# Patient Record
Sex: Female | Born: 1970
Health system: Southern US, Community
[De-identification: ages and names within clinical notes are randomized; demographics above are authoritative.]

## PROBLEM LIST (undated history)

## (undated) DIAGNOSIS — M1990 Unspecified osteoarthritis, unspecified site: Secondary | ICD-10-CM

## (undated) DIAGNOSIS — M419 Scoliosis, unspecified: Secondary | ICD-10-CM

## (undated) HISTORY — DX: Unspecified osteoarthritis, unspecified site: M19.90

## (undated) HISTORY — DX: Scoliosis, unspecified: M41.9

## (undated) HISTORY — PX: HAND SURGERY: SHX662

---

## 1995-08-14 HISTORY — PX: COCCYX REMOVAL: SHX600

## 1998-07-05 ENCOUNTER — Ambulatory Visit (HOSPITAL_COMMUNITY): Admission: RE | Admit: 1998-07-05 | Discharge: 1998-07-05 | Payer: Self-pay | Admitting: Obstetrics & Gynecology

## 1998-07-05 ENCOUNTER — Encounter: Payer: Self-pay | Admitting: Obstetrics & Gynecology

## 1999-07-04 ENCOUNTER — Encounter: Payer: Self-pay | Admitting: Obstetrics and Gynecology

## 1999-07-04 ENCOUNTER — Ambulatory Visit (HOSPITAL_COMMUNITY): Admission: RE | Admit: 1999-07-04 | Discharge: 1999-07-04 | Payer: Self-pay | Admitting: Obstetrics and Gynecology

## 1999-11-13 ENCOUNTER — Inpatient Hospital Stay (HOSPITAL_COMMUNITY): Admission: AD | Admit: 1999-11-13 | Discharge: 1999-11-13 | Payer: Self-pay | Admitting: Obstetrics and Gynecology

## 1999-11-30 ENCOUNTER — Inpatient Hospital Stay (HOSPITAL_COMMUNITY): Admission: AD | Admit: 1999-11-30 | Discharge: 1999-12-02 | Payer: Self-pay | Admitting: Obstetrics and Gynecology

## 2000-04-03 ENCOUNTER — Other Ambulatory Visit: Admission: RE | Admit: 2000-04-03 | Discharge: 2000-04-03 | Payer: Self-pay | Admitting: Obstetrics and Gynecology

## 2000-08-13 HISTORY — PX: CHOLECYSTECTOMY: SHX55

## 2000-11-27 ENCOUNTER — Emergency Department (HOSPITAL_COMMUNITY): Admission: EM | Admit: 2000-11-27 | Discharge: 2000-11-27 | Payer: Self-pay | Admitting: Emergency Medicine

## 2000-12-11 ENCOUNTER — Encounter: Admission: RE | Admit: 2000-12-11 | Discharge: 2000-12-11 | Payer: Self-pay

## 2001-05-02 ENCOUNTER — Other Ambulatory Visit: Admission: RE | Admit: 2001-05-02 | Discharge: 2001-05-02 | Payer: Self-pay | Admitting: Obstetrics and Gynecology

## 2001-05-22 ENCOUNTER — Encounter: Admission: RE | Admit: 2001-05-22 | Discharge: 2001-06-30 | Payer: Self-pay | Admitting: Orthopaedic Surgery

## 2001-08-12 ENCOUNTER — Encounter: Payer: Self-pay | Admitting: Orthopaedic Surgery

## 2001-08-12 ENCOUNTER — Encounter: Admission: RE | Admit: 2001-08-12 | Discharge: 2001-08-12 | Payer: Self-pay | Admitting: Orthopaedic Surgery

## 2001-09-26 ENCOUNTER — Encounter: Payer: Self-pay | Admitting: Family Medicine

## 2001-09-26 ENCOUNTER — Encounter: Admission: RE | Admit: 2001-09-26 | Discharge: 2001-09-26 | Payer: Self-pay | Admitting: Family Medicine

## 2002-05-08 ENCOUNTER — Other Ambulatory Visit: Admission: RE | Admit: 2002-05-08 | Discharge: 2002-05-08 | Payer: Self-pay | Admitting: Obstetrics and Gynecology

## 2003-09-02 ENCOUNTER — Other Ambulatory Visit: Admission: RE | Admit: 2003-09-02 | Discharge: 2003-09-02 | Payer: Self-pay | Admitting: Obstetrics and Gynecology

## 2004-03-09 ENCOUNTER — Inpatient Hospital Stay (HOSPITAL_COMMUNITY): Admission: AD | Admit: 2004-03-09 | Discharge: 2004-03-11 | Payer: Self-pay | Admitting: Obstetrics and Gynecology

## 2004-03-26 ENCOUNTER — Inpatient Hospital Stay (HOSPITAL_COMMUNITY): Admission: AD | Admit: 2004-03-26 | Discharge: 2004-03-29 | Payer: Self-pay | Admitting: Obstetrics and Gynecology

## 2005-09-11 ENCOUNTER — Encounter: Admission: RE | Admit: 2005-09-11 | Discharge: 2005-09-11 | Payer: Self-pay | Admitting: Family Medicine

## 2005-09-17 ENCOUNTER — Encounter: Admission: RE | Admit: 2005-09-17 | Discharge: 2005-09-17 | Payer: Self-pay | Admitting: Family Medicine

## 2005-09-25 ENCOUNTER — Other Ambulatory Visit: Admission: RE | Admit: 2005-09-25 | Discharge: 2005-09-25 | Payer: Self-pay | Admitting: Obstetrics and Gynecology

## 2006-11-07 ENCOUNTER — Ambulatory Visit: Payer: Self-pay | Admitting: Family Medicine

## 2006-11-07 LAB — CONVERTED CEMR LAB
Basophils Absolute: 0 10*3/uL (ref 0.0–0.1)
Basophils Relative: 0.3 % (ref 0.0–1.0)
Cholesterol: 170 mg/dL (ref 0–200)
Eosinophils Absolute: 0.1 10*3/uL (ref 0.0–0.6)
Eosinophils Relative: 1.3 % (ref 0.0–5.0)
Glucose, Bld: 80 mg/dL (ref 70–99)
HCT: 39.5 % (ref 36.0–46.0)
HDL: 36.1 mg/dL — ABNORMAL LOW (ref >39.0)
Hemoglobin: 13.7 g/dL (ref 12.0–15.0)
LDL Cholesterol: 115 mg/dL — ABNORMAL HIGH (ref 0–99)
Lymphocytes Relative: 33 % (ref 12.0–46.0)
MCHC: 34.8 g/dL (ref 30.0–36.0)
MCV: 83.4 fL (ref 78.0–100.0)
Monocytes Absolute: 0.5 10*3/uL (ref 0.2–0.7)
Monocytes Relative: 5.8 % (ref 3.0–11.0)
Neutro Abs: 5 10*3/uL (ref 1.4–7.7)
Neutrophils Relative %: 59.6 % (ref 43.0–77.0)
Platelets: 296 10*3/uL (ref 150–400)
RBC: 4.74 M/uL (ref 3.87–5.11)
RDW: 12.9 % (ref 11.5–14.6)
TSH: 2.83 microintl units/mL (ref 0.35–5.50)
Total CHOL/HDL Ratio: 4.7
Triglycerides: 95 mg/dL (ref 0–149)
VLDL: 19 mg/dL (ref 0–40)
WBC: 8.3 10*3/uL (ref 4.5–10.5)

## 2007-04-30 ENCOUNTER — Ambulatory Visit: Payer: Self-pay | Admitting: Family Medicine

## 2007-04-30 DIAGNOSIS — M129 Arthropathy, unspecified: Secondary | ICD-10-CM | POA: Insufficient documentation

## 2007-04-30 LAB — CONVERTED CEMR LAB: Anti Nuclear Antibody(ANA): NEGATIVE

## 2007-05-04 LAB — CONVERTED CEMR LAB
Basophils Absolute: 0.1 10*3/uL (ref 0.0–0.1)
MCHC: 33.7 g/dL (ref 30.0–36.0)
Monocytes Absolute: 0.6 10*3/uL (ref 0.2–0.7)
Neutrophils Relative %: 52.7 % (ref 43.0–77.0)
Platelets: 245 10*3/uL (ref 150–400)
RBC: 4.46 M/uL (ref 3.87–5.11)
RDW: 13.4 % (ref 11.5–14.6)
Rhuematoid fact SerPl-aCnc: <20 intl units/mL — ABNORMAL LOW (ref 0.0–20.0)
Sed Rate: 40 mm/hr — ABNORMAL HIGH (ref 0–25)
WBC: 6.8 10*3/uL (ref 4.5–10.5)

## 2007-06-20 ENCOUNTER — Encounter (INDEPENDENT_AMBULATORY_CARE_PROVIDER_SITE_OTHER): Payer: Self-pay | Admitting: Family Medicine

## 2007-09-18 ENCOUNTER — Telehealth (INDEPENDENT_AMBULATORY_CARE_PROVIDER_SITE_OTHER): Payer: Self-pay | Admitting: *Deleted

## 2007-09-19 ENCOUNTER — Ambulatory Visit: Payer: Self-pay | Admitting: Family Medicine

## 2007-09-19 DIAGNOSIS — K209 Esophagitis, unspecified without bleeding: Secondary | ICD-10-CM | POA: Insufficient documentation

## 2007-09-19 DIAGNOSIS — R079 Chest pain, unspecified: Secondary | ICD-10-CM | POA: Insufficient documentation

## 2007-09-22 ENCOUNTER — Encounter (INDEPENDENT_AMBULATORY_CARE_PROVIDER_SITE_OTHER): Payer: Self-pay | Admitting: *Deleted

## 2007-09-24 ENCOUNTER — Ambulatory Visit: Payer: Self-pay | Admitting: Family Medicine

## 2007-09-26 ENCOUNTER — Telehealth (INDEPENDENT_AMBULATORY_CARE_PROVIDER_SITE_OTHER): Payer: Self-pay | Admitting: *Deleted

## 2007-10-24 ENCOUNTER — Ambulatory Visit: Payer: Self-pay | Admitting: Family Medicine

## 2007-10-24 DIAGNOSIS — IMO0001 Reserved for inherently not codable concepts without codable children: Secondary | ICD-10-CM | POA: Insufficient documentation

## 2007-10-27 ENCOUNTER — Encounter (INDEPENDENT_AMBULATORY_CARE_PROVIDER_SITE_OTHER): Payer: Self-pay | Admitting: *Deleted

## 2007-11-05 ENCOUNTER — Ambulatory Visit: Payer: Self-pay | Admitting: Cardiology

## 2007-11-20 ENCOUNTER — Ambulatory Visit: Payer: Self-pay | Admitting: Gastroenterology

## 2007-11-20 LAB — CONVERTED CEMR LAB: Tissue Transglutaminase Ab, IgA: 0.5 units (ref <7)

## 2007-11-24 ENCOUNTER — Ambulatory Visit: Payer: Self-pay | Admitting: Gastroenterology

## 2007-11-24 ENCOUNTER — Encounter: Payer: Self-pay | Admitting: Internal Medicine

## 2007-11-24 ENCOUNTER — Encounter: Payer: Self-pay | Admitting: Gastroenterology

## 2007-12-10 ENCOUNTER — Ambulatory Visit: Payer: Self-pay | Admitting: Internal Medicine

## 2008-02-06 ENCOUNTER — Ambulatory Visit (HOSPITAL_COMMUNITY): Admission: RE | Admit: 2008-02-06 | Discharge: 2008-02-06 | Payer: Self-pay | Admitting: Obstetrics and Gynecology

## 2008-02-25 ENCOUNTER — Encounter: Admission: RE | Admit: 2008-02-25 | Discharge: 2008-02-25 | Payer: Self-pay | Admitting: Obstetrics and Gynecology

## 2008-07-14 ENCOUNTER — Emergency Department (HOSPITAL_COMMUNITY): Admission: EM | Admit: 2008-07-14 | Discharge: 2008-07-14 | Payer: Self-pay | Admitting: Family Medicine

## 2008-08-30 ENCOUNTER — Encounter: Admission: RE | Admit: 2008-08-30 | Discharge: 2008-08-30 | Payer: Self-pay | Admitting: Obstetrics and Gynecology

## 2009-04-21 ENCOUNTER — Ambulatory Visit: Payer: Self-pay | Admitting: Family Medicine

## 2009-05-12 ENCOUNTER — Ambulatory Visit: Payer: Self-pay | Admitting: Family Medicine

## 2009-05-18 ENCOUNTER — Telehealth (INDEPENDENT_AMBULATORY_CARE_PROVIDER_SITE_OTHER): Payer: Self-pay | Admitting: *Deleted

## 2009-05-18 ENCOUNTER — Telehealth: Payer: Self-pay | Admitting: Family Medicine

## 2009-05-18 LAB — CONVERTED CEMR LAB
ALT: 56 units/L — ABNORMAL HIGH (ref 0–35)
Albumin: 3.8 g/dL (ref 3.5–5.2)
Basophils Relative: 0.3 % (ref 0.0–3.0)
Chloride: 104 meq/L (ref 96–112)
Creatinine, Ser: 0.6 mg/dL (ref 0.4–1.2)
Eosinophils Relative: 1.6 % (ref 0.0–5.0)
GFR calc non Af Amer: 118.83 mL/min (ref >60)
Hemoglobin: 13.1 g/dL (ref 12.0–15.0)
LDL Cholesterol: 107 mg/dL — ABNORMAL HIGH (ref 0–99)
MCHC: 34.5 g/dL (ref 30.0–36.0)
Monocytes Absolute: 0.6 10*3/uL (ref 0.1–1.0)
Monocytes Relative: 7.4 % (ref 3.0–12.0)
Neutro Abs: 4.3 10*3/uL (ref 1.4–7.7)
Potassium: 4.3 meq/L (ref 3.5–5.1)
RBC: 4.43 M/uL (ref 3.87–5.11)
TSH: 2.03 microintl units/mL (ref 0.35–5.50)
Triglycerides: 109 mg/dL (ref 0.0–149.0)
VLDL: 21.8 mg/dL (ref 0.0–40.0)
WBC: 7.6 10*3/uL (ref 4.5–10.5)

## 2009-05-23 ENCOUNTER — Encounter: Payer: Self-pay | Admitting: Family Medicine

## 2009-10-20 ENCOUNTER — Telehealth (INDEPENDENT_AMBULATORY_CARE_PROVIDER_SITE_OTHER): Payer: Self-pay | Admitting: *Deleted

## 2010-09-12 NOTE — Progress Notes (Signed)
   Phone Note From Other Clinic   Caller: [facility] Details for Reason: Pt.Information Initial call taken by: KM    Faxed 12 lead over to [facility] to fax [phone] [facility]  October 20, 2009 10:44 AM

## 2010-12-26 NOTE — Assessment & Plan Note (Signed)
[facility]                         GASTROENTEROLOGY OFFICE NOTE   KAREEMAH, GROUNDS                      MRN:          [id number]  DATE:11/20/2007                            DOB:          [birth year]    Mrs. Bufano is a 40 year old [place] female referred through the  courtesy of Dr. Blossom Hoops for evaluation of acid reflux symptoms.   Mrs. Castrillo had cholecystectomy in April 2002 because of abdominal and  chest pain with gas and bloating, but her symptoms did not abate.  She  has continued with rather typical burping, belching, regurgitation and  burning substernal chest pain despite taking a variety of medications.  She most recently had been on Prevacid with some improvement, but ran  out about a week ago and has worsening of her symptoms.  She gives a  clear history of [name] which contributes to her reflux.  She denies a globus sensation, cough, hoarseness, asthma, or dysphagia.  She denies any known hepatobiliary complaints.  She has regular bowel  movements without melena or hematochezia.  She does have a lot of  nocturnal acid reflux symptoms additionally.   PAST MEDICAL HISTORY:  Remarkable for:  1. Fibromyalgia, she has multiple joint aches and pains.  2. She also may well have myasthenia gravis, since she is having some      problems with left eye drooping and is being evaluated      neurologically.  She has been seen by Dr. Antoine Poche, has had a      negative cardiac evaluation but has not had stress testing.  She      otherwise has been in good health and denies chronic medical      problems except for some low back disk problems.  Her gallbladder      was removed in April of 2002.   MEDICATIONS:  1. Mobic 7.5 mg a day.  2. Tramadol 50 mg a day.  3. Calcium.  4. Vitamin C.  5. Multivitamins with vitamin E 400 units a day.   ALLERGIES:  Without known drug allergies.   FAMILY HISTORY:  Noncontributory.   SOCIAL  HISTORY:  She is married, lives with her husband and works as a  Diplomatic Services operational officer.  She does not smoke or abuse ethanol.   REVIEW OF SYSTEMS:  Noncontributory with regular menses.  She has some  vague sleeping problems, chronic back pain, myalgias.  Denies any  current cardiovascular, pulmonary, or neuropsychiatric problems except  as mentioned above.  Review of systems otherwise noncontributory without  facial rashes, post sensitivity, or other symptoms suggestive of  collagen vascular disease.   PHYSICAL EXAMINATION:  GENERAL:  She is an attractive healthy-appearing  Hispanic female in no distress appearing her stated age.  VITAL SIGNS:  She is 5 feet 1 inch and weighs 119 pounds.  Blood  pressure 110/64 and pulse was 72 and regular.  I could not appreciate  stigmata of chronic liver disease or thyromegaly.  CHEST:  Clear.  CARDIAC:  Regular rhythm without murmurs, gallops or rubs.  ABDOMEN:  I could not appreciate hepatosplenomegaly, abdominal masses or  tenderness.  Bowel sounds were normal.  EXTREMITIES:  Peripheral extremities were unremarkable.  MENTAL STATUS:  Clear.  RECTAL:  Deferred.   ASSESSMENT:  1. Status post cholecystectomy for a dysfunctional gallbladder.  2. Chronic acid reflux with a probable associated esophageal motility      disorder associated with [name].  3. Symptoms consistent with irritable bowel syndrome and probable      malabsorption, nondigestible carbohydrates.  4. History of NSAIDS use for fibromyalgia which may be exacerbating      some of her gastrointestinal symptomatology.  5. Recurrent chest pain probably related to acid reflux.  The patient      did have a recent negative Helicobacter pylori antibody on      09/19/2007.  At that time, electrocardiogram and chest x-ray were      also normal.   RECOMMENDATION:  1. Outpatient endoscopy.  2. Consider esophageal manometry.  3. Restart Prevacid 30 mg before breakfast and supper.  4.  Check antinuclear antibody.  5. Continue other medications per Dr. Blossom Hoops.     Vania Rea. Jarold Motto, MD, Caleen Essex, FAGA  Electronically Signed    DRP/MedQ  DD: 11/20/2007  DT: 11/20/2007  Job #: [id number]

## 2010-12-26 NOTE — Assessment & Plan Note (Signed)
[facility]                            CARDIOLOGY OFFICE NOTE   Kimberly Horne, Kimberly Horne                      MRN:          [id number]  DATE:11/05/2007                            DOB:          19-Mar-1971    PRIMARY CARE PHYSICIAN:  Leanne Chang, M.D.   REASON FOR PRESENTATION:  Evaluate patient with chest pain.   HISTORY OF PRESENT ILLNESS:  The patient is a pleasant 40 year old  Hispanic female.  She had a history of chest discomfort starting about 2  months ago.  It was happening a couple of times per week.  She was  treated with Prevacid and this kind of discomfort has actually gone  away.  However, she continues to have some discomfort.  This is  substernal.  It is a pressure.  It hurts when she moves or bends over.  It lasts for a few seconds.  She may get a little short of breath with  this.  When she straightens up, it goes away.  There is no radiation to  her jaw or to her arms.  She does have arm pain and discomfort separate  from this.  She says that she has been told she might have fibromyalgia.  She is not particularly active, but she climbs stairs frequently in her  house.  With this, she does not get any shortness of breath or chest  discomfort.  She does not have any PND or orthopnea.  She rarely gets  palpitations and does not have presyncope or syncope.  She does feel  like she has this kind of nonproductive cough that has been going on for  quite awhile.   PAST MEDICAL HISTORY:  Gastroesophageal reflux disease.  She does not  have any history of hypertension, diabetes or hyperlipidemia.   PAST SURGICAL HISTORY:  1. Cholecystectomy in 2002.  2. Coccyx removed in 1998.   ALLERGIES:  NONE.   MEDICATIONS:  1. Prevacid 30 mg daily.  2. Vitamin E.  3. Multivitamin.  4. Calcium.  5. Flexeril.   SOCIAL HISTORY:  The patient is a Diplomatic Services operational officer.  She is married.  She has 2  young daughters.  She has never smoked cigarettes and does not  drink  alcohol.   FAMILY HISTORY:  Noncontributory for early coronary artery disease.   REVIEW OF SYSTEMS:  Positive occasional dizziness when turning her head,  reflux.  Negative for other systems.   PHYSICAL EXAMINATION:  GENERAL:  The patient is in no distress.  VITAL SIGNS:  Blood pressure 110/71, heart rate 92 and regular, weight  121 pounds, body mass index 22.  HEENT:  Eyelids unremarkable, pupils are equal, round and reactive to  light, fundi not visualized, oral mucosa unremarkable.  NECK:  No jugular distention at 45 degrees, carotid upstroke brisk and  symmetric, no bruits, no thyromegaly.  LYMPHATICS:  No cervical, axillary or inguinal adenopathy.  LUNGS:  Clear to auscultation bilaterally.  BACK:  No costovertebral angle tenderness.  CHEST:  Unremarkable.  HEART:  PMI not displaced or sustained, S1-S2 within normal limits.  No  S3-S4, no clicks, rubs,  murmurs.  ABDOMEN:  Flat, positive bowel sounds normal in frequency and pitch, no  bruits, rebound, guarding or midline pulsatile mass.  No hepatomegaly or  splenomegaly.  SKIN:  No rashes.  No nodules.  EXTREMITIES:  2+ pulses throughout, no edema, cyanosis, clubbing.  NEURO:  Oriented to person, place, time, cranial nerves II-XII are  grossly intact, motor grossly intact.   DIAGNOSTICS:  EKG:  Sinus rhythm, rate 82, axis within normal limits,  intervals within normal limits, no acute ST/T wave changes.   ASSESSMENT/PLAN:  Chest discomfort.  The patient's chest discomfort is  very atypical.  She has a normal physical exam.  Normal EKG.  She has no  significant cardiovascular risk factors.  Given this, the pretest  probability of obstructive coronary disease as the cause of her pain is  extremely low.  I do not think further cardiovascular testing would be  warranted.  The possibility of identifying disease is actually much  lower than the possibility of a false positive test with stress testing  or other imaging.  I  discussed this with the patient.  She may well have  musculoskeletal pain as well as GI discomfort and will continue to  follow with Dr. Blossom Hoops for this.   FOLLOW UP:  She can come back to this clinic as needed.     Rollene Rotunda, MD, [facility]  Electronically Signed    JH/MedQ  DD: 11/05/2007  DT: 11/06/2007  Job #: [id number]   cc:   Leanne Chang, M.D.

## 2010-12-29 NOTE — Discharge Summary (Signed)
[facility]  Patient:    Kimberly Horne, Kimberly Horne                      MRN: [id number] Adm. Date:  [id number] Disc. Date: [id number] Attending:  Oliver Pila                           Discharge Summary  HISTORY OF PRESENT ILLNESS:   A 40 year old Hispanic female, para 0 gravida 1, DC December 05, 1999 by last period compatible with a first trimester sonogram, presented with contractions every five minutes.  She denied leakage of fluid, vaginal spotting.  She had good fetal movement.  Pregnancy uncomplicated except history of infertility.  PRENATAL LABORATORY:          A positive with a negative antibody, RPR nonreactive, rubella immune, hepatitis B surface antigen negative, HIV negative, GC and chlamydia negative, group B strep negative, one-hour glucola 98.  MEDICATIONS:                  Prenatal vitamins.  ALLERGIES:                    None.  PAST OBSTETRIC AND GYNECOLOGIC HISTORY:          Negative.  PAST MEDICAL HISTORY:         Negative.  SURGICAL HISTORY:             In 1997, coccyx removed.  HOSPITAL COURSE:              Physical exam on admission revealed normal vital signs, reactive nonstress test, heart and lungs normal.  Abdomen was gravid and  nontender.  Cervix was 5-6 cm, 90%, vertex, -1 in the office.  The patient received an epidural.  By 6 p.m. she had progressed to 8 cm with the vertex at a 0 station. She had light meconium-stained fluid.  Pitocin was added.  By 6:45 p.m. she was  9 cm.  She progressed to complete dilatation and pushed well, with spontaneous vaginal delivery of a vigorous female infant over a second degree perineal laceration, weight 7 pounds 12 ounces, Apgars 9 and 9 at one and five minutes.  There was light meconium staining, DeLee suctioned after delivery of the head.  Placenta was delivered spontaneously, three-vessel cord.  Second degree laceration repaired with 2-0 Vicryl.  Cervix and rectum were  intact.  There was mild postpartum uterine atony, responded well to Pitocin and IM Methergine x 1. Blood loss about 450 cc.  Dr. Senaida Ores was in attendance.  Postpartum, the patient id quite well and was ready for discharge on postpartum day #2.  LABORATORY DATA:              Initial hemoglobin 13.2, hematocrit 37.9.  White count 15,600.  Platelet count 276,000.  RPR was nonreactive.  Follow-up hemoglobin was 10.3, hematocrit 29.9. White count 18,600.  Platelet count 248,000.  FINAL DIAGNOSIS:              Intrauterine pregnancy at 39+ weeks delivered vertex.  OPERATION:                    Spontaneous vaginal delivery, repair of midline laceration.  FINAL CONDITION:              Improved.  INSTRUCTIONS:  Include regular discharge instruction booklet.  MEDICATIONS:                  Ibuprofen 800 mg #20 tablets 1 q.8h. as needed for pain.  FOLLOW-UP:                    Patient is advised to return in six weeks for follow-up examination.DD:  12/02/99 TD:  12/04/99 Job: [id number]

## 2010-12-29 NOTE — Discharge Summary (Signed)
NAMELATONYIA, LOPATA                         ACCOUNT NO.:  [id number]   MEDICAL RECORD NO.:  [id number]                   PATIENT TYPE:  INP   LOCATION:  9104                                 [facility]ACILITY:  [facility]   PHYSICIAN:  Malachi Pro. Ambrose Mantle, M.D.              DATE OF BIRTH:  [birth year]   DATE OF ADMISSION:  03/09/2004  DATE OF DISCHARGE:  03/11/2004                                 DISCHARGE SUMMARY   A 40 year old female, para 1-0-0-1, gravida 2, EDC March 12, 2004 by 10 week  ultrasound and last period presented to Labor and Delivery with some  contractions and cervical dilatation for augmentation of labor. Prenatal  care was complicated by positive group B strep in the urine, no other  issues. Blood group and type, A positive with a negative antibody, RPR  nonreactive, rubella immune, hepatitis B surface antigen negative, HIV  negative, GC and chlamydia negative, triple screen negative, group B strep  positive, one hour Glucola 108.   PAST OB HISTORY:  In April of 2001, she delivered spontaneously a 7 pound 12  ounce infant.   GYN HISTORY:  Negative.   SURGICAL HISTORY:  Fracture of the coccyx and also cholecystectomy.   PAST MEDICAL HISTORY:  Negative, no known allergies, no medications.   PHYSICAL EXAMINATION:  VITAL SIGNS:  On admission, the patient was afebrile  with normal vital signs.  Contractions were every 6-8 minutes.  HEART:  Normal size and sounds, no murmurs.  LUNGS:  Clear to P&A.  ABDOMEN:  Soft, nontender, estimated fetal weight at 7.5  to 8 pounds. The  cervix was 4-5 cm, 75%, vertex at a -1 station.  Artificial rupture of the  membranes was done and Pitocin was begun.   The patient received an epidural, reached complete dilatation and pushed  well with a spontaneous delivery of a vigorous female infant over a small  second degree laceration. Apgar's were 9 & 9, weight 8 pounds 9 ounces.  Dr.  Senaida Ores was in attendance. The placenta delivered spontaneously  with a  three vessel cord. A small second degree laceration repair with 2-0 Vicryl.  The cervix and rectum intact. Blood loss about 350 mL.  Postpartum the  patient did quite well and was discharged on the second post partum day. RPR  was nonreactive, hemoglobin 12.7, hematocrit 38.2, white count 10,800,  platelet count 193,000 on admission.  Followup hemoglobin 10.4, hematocrit  31.1, white count 12,800.  There were 73 segs, 19 lymphs, 7 monos, 1  eosinophil and no basophils.   FINAL DIAGNOSES:  Intrauterine pregnancy at 39 plus weeks delivered vertex.   OPERATION:  Spontaneous delivery vertex, repair of second degree laceration.   FINAL CONDITION:  Improved.   DISCHARGE INSTRUCTIONS:  Instructions include our regular discharge  instruction booklet. The patient is given a prescription for ibuprofen 600  mg, 20 tablets, 1 every 6-8 hours as needed for pain and  is asked to return  to the office in six weeks for followup examination.                                               Malachi Pro. Ambrose Mantle, M.D.    TFH/MEDQ  D:  03/11/2004  T:  03/11/2004  Job:  [id number]

## 2010-12-29 NOTE — Discharge Summary (Signed)
[name], Kimberly Horne                         ACCOUNT NO.:  [id number]   MEDICAL RECORD NO.:  [id number]                   PATIENT TYPE:  INP   LOCATION:  9305                                 [facility]ILITY[facility]  [facility]   PHYSICIAN:  Malachi Pro. Ambrose Mantle, M.D.              DATE OF BIRTH:  1971/04/02   DATE OF ADMISSION:  03/26/2004  DATE OF DISCHARGE:                                 DISCHARGE SUMMARY   HOSPITAL COURSE:  This is a 40 year old female para 2-0-0-2 17 days  postpartum with elevated temperature and probable mastitis.  The patient's  history is dictated in the admission History and Physical Exam.  After  admission to the hospital she was placed on Unasyn because the etiology of  her fever was not completely clear.  Even though her main complaint was  severe bilateral breast pain she had no specific redness in her breasts.  After admission to the hospital she remained febrile for close to 30 hours  but now has remained afebrile for 30 hours, feels much better, and is a  candidate for discharge.  Urine culture showed no growth.  Blood cultures  are pending.  Initial hemoglobin 13.9; hematocrit 40.8; white count 9100;  platelet count 240,000.  Initial comprehensive metabolic profile showed an  SGOT of 49, SGPT of 52, but on repeat they were 29 and 38.  The blood  cultures will be held for 5 days before submitting a final report.  Urinalysis showed 3-6 red cells, 15% ketones, otherwise normal.  Ultrasound  showed no significant abnormalities.  The patient's temperature rose as high  as 103.4 degrees but now she has been afebrile for 30 hours.   FINAL DIAGNOSIS:  Probable mastitis.   OPERATION:  None.   FINAL CONDITION:  Improved.   Instructions include regular diet, follow our discharge instruction booklet,  Augmentin 500 mg p.o. q.8h. for 7 days, and return to the office in 1 week  for follow-up examination.                                               Malachi Pro. Ambrose Mantle, M.D.    TFH/MEDQ  D:  03/29/2004  T:  03/29/2004  Job:  [id number]

## 2010-12-29 NOTE — H&P (Signed)
Kimberly Horne, Kimberly Horne                         ACCOUNT NO.:  [id number]   MEDICAL RECORD NO.:  [id number]                   PATIENT TYPE:  INP   LOCATION:  9305                                 [facility]ILITY[facility]   PHYSICIAN:  Malachi Pro. Ambrose Mantle, M.D.              DATE OF BIRTH:  [birth year]   DATE OF ADMISSION:  03/26/2004  DATE OF DISCHARGE:                                HISTORY & PHYSICAL   HISTORY OF PRESENT ILLNESS:  This is a 40 year old white female, para 2-0-0-  2, who is admitted to the hospital  for elevated fever, possibly mastitis.  This patient delivered vaginally on March 09, 2004.  She  was discharged on  the second postpartum day and approximately four days postpartum after  having noted soreness of her nipples, she began noticing quite severe  engorgement of her breasts.  She states that she would not breast feed more  often than every three hours and she became quite tender in her breasts.  She states that she began running a fever on March 22, 2004 and thought  that it would go away.  Again on March 23, 2004 she had fever.  On March 24, 2004, she called Dr. Senaida Ores because of a temperature elevation of  approximately 100.8.  She was given dicloxacillin to take three times daily.  She states that she began this on March 24, 2004 and felt fine on March 25, 2004 and awoke this morning and before noon had a fever of 102 degrees.  She called me and was seen in maternity admission unit with a fever of  103.2.  This subsequently went to 103.4.  She states that the only  significant discomfort she has had is in both breasts.  She also has had  some right lower abdominal pain.  She denies sore throat, cough, nausea,  vomiting, diarrhea, or urinary symptoms.   ALLERGIES:  No known allergies.   PAST SURGICAL HISTORY:  No operations.   PAST MEDICAL HISTORY:  No significant medical illnesses.   OB HISTORY:  She has had two vaginal deliveries, the last being on March 09, 2004.   PHYSICAL EXAMINATION:  GENERAL:  Physical examination reveals a somewhat  toxic appearing female with a temperature of 103.4, pulse 111, respirations  20, and blood pressure 135/80.  HEENT:  No cranial abnormalities.  Extraocular movements are intact.  Nose  and pharynx are clear.  NECK: Supple without thyromegaly.  HEART: Normal size and sounds except there is tachycardia.  There are  murmurs.  LUNGS:  Clear to P&A.  BREASTS:  Somewhat tender bilaterally.  There is no specific redness seen.  The left breast more than the right is engorged.  There is no CVA  tenderness.  ABDOMEN:  Soft and nontender.  The uterus itself is slightly tender and is  approximately halfway to the umbilicus.  PELVIC:  Normal external genitalia.  The  uterus is palpated in the midline.  Is slightly tender to motion, about 14-16 weeks size.  No adnexal masses  appreciated.   LABORATORY DATA:  Comprehensive metabolic profile is normal except for  minimal elevation in the SGOT and SGPT at 49 and 52 respectively.  Urinalysis is negative.  CBC shows white count of 9100, hemoglobin 13.9,  hematocrit 40.8, platelet count 240,000.   ADMITTING IMPRESSION:  Postpartum febrile morbidity, quite likely secondary  to mastitis but a definite diagnosis cannot be made, possible endometritis.  The patient is admitted for IV antibiotic therapy. Since I cannot  determine with precision the origin of the fever, I am going to treat her  with Unasyn 3 g IV q.6h.  after having received an initial dose of Kefzol 1  g.  The patient also will be given Tylenol for fever and she is admitted to  the hospital.                                               Malachi Pro. Ambrose Mantle, M.D.    TFH/MEDQ  D:  03/26/2004  T:  03/26/2004  Job:  [id number]

## 2011-03-05 ENCOUNTER — Other Ambulatory Visit (HOSPITAL_COMMUNITY): Payer: Self-pay | Admitting: Obstetrics and Gynecology

## 2011-03-05 DIAGNOSIS — Z1231 Encounter for screening mammogram for malignant neoplasm of breast: Secondary | ICD-10-CM

## 2011-03-30 ENCOUNTER — Ambulatory Visit (HOSPITAL_COMMUNITY): Payer: PRIVATE HEALTH INSURANCE | Attending: Obstetrics and Gynecology

## 2011-05-16 ENCOUNTER — Other Ambulatory Visit (HOSPITAL_COMMUNITY): Payer: Self-pay | Admitting: Internal Medicine

## 2011-05-16 DIAGNOSIS — Z1231 Encounter for screening mammogram for malignant neoplasm of breast: Secondary | ICD-10-CM

## 2011-05-22 ENCOUNTER — Ambulatory Visit (HOSPITAL_COMMUNITY)
Admission: RE | Admit: 2011-05-22 | Discharge: 2011-05-22 | Disposition: A | Discharge status: Home / Self Care | Payer: PRIVATE HEALTH INSURANCE | Source: Ambulatory Visit | Attending: Internal Medicine | Admitting: Internal Medicine

## 2011-05-22 DIAGNOSIS — Z1231 Encounter for screening mammogram for malignant neoplasm of breast: Secondary | ICD-10-CM | POA: Insufficient documentation

## 2012-05-30 ENCOUNTER — Other Ambulatory Visit (HOSPITAL_COMMUNITY): Payer: Self-pay | Admitting: Internal Medicine

## 2012-05-30 DIAGNOSIS — Z1231 Encounter for screening mammogram for malignant neoplasm of breast: Secondary | ICD-10-CM

## 2012-06-16 ENCOUNTER — Ambulatory Visit (HOSPITAL_COMMUNITY)
Admission: RE | Admit: 2012-06-16 | Discharge: 2012-06-16 | Disposition: A | Discharge status: Home / Self Care | Payer: BC Managed Care – PPO | Source: Ambulatory Visit | Attending: Internal Medicine | Admitting: Internal Medicine

## 2012-06-16 DIAGNOSIS — Z1231 Encounter for screening mammogram for malignant neoplasm of breast: Secondary | ICD-10-CM

## 2015-01-25 ENCOUNTER — Ambulatory Visit (INDEPENDENT_AMBULATORY_CARE_PROVIDER_SITE_OTHER): Payer: BLUE CROSS/BLUE SHIELD | Admitting: Neurology

## 2015-01-25 ENCOUNTER — Ambulatory Visit (INDEPENDENT_AMBULATORY_CARE_PROVIDER_SITE_OTHER): Payer: Self-pay | Admitting: Neurology

## 2015-01-25 ENCOUNTER — Encounter: Payer: Self-pay | Admitting: Neurology

## 2015-01-25 DIAGNOSIS — IMO0001 Reserved for inherently not codable concepts without codable children: Secondary | ICD-10-CM

## 2015-01-25 DIAGNOSIS — M79622 Pain in left upper arm: Secondary | ICD-10-CM

## 2015-01-25 DIAGNOSIS — M791 Myalgia: Secondary | ICD-10-CM

## 2015-01-25 DIAGNOSIS — M79602 Pain in left arm: Secondary | ICD-10-CM | POA: Diagnosis not present

## 2015-01-25 DIAGNOSIS — M25522 Pain in left elbow: Secondary | ICD-10-CM

## 2015-01-25 DIAGNOSIS — M797 Fibromyalgia: Secondary | ICD-10-CM | POA: Diagnosis not present

## 2015-01-25 DIAGNOSIS — M609 Myositis, unspecified: Secondary | ICD-10-CM

## 2015-01-25 NOTE — Progress Notes (Signed)
Please refer to EMG and nerve conduction study procedure note. 

## 2015-01-25 NOTE — Procedures (Signed)
     HISTORY:  Kimberly Horne is a 44 year old patient with a ten-year history diffuse neck and back discomfort. The patient reports pain down the left arm. She has a history of fibromyalgia and scoliosis. She is being evaluated for possible neuropathy or a cervical radiculopathy.  NERVE CONDUCTION STUDIES:  Nerve conduction studies were performed on the left upper extremity. The distal motor latencies and motor amplitudes for the median and ulnar nerves were within normal limits. The F wave latencies and nerve conduction velocities for these nerves were also normal. The sensory latencies for the median and ulnar nerves were normal.   EMG STUDIES:  EMG study was performed on the left upper extremity:  The first dorsal interosseous muscle reveals 2 to 4 K units with full recruitment. No fibrillations or positive waves were noted. The abductor pollicis brevis muscle reveals 2 to 4 K units with full recruitment. No fibrillations or positive waves were noted. The extensor indicis proprius muscle reveals 1 to 3 K units with full recruitment. No fibrillations or positive waves were noted. The pronator teres muscle reveals 2 to 3 K units with full recruitment. No fibrillations or positive waves were noted. The biceps muscle reveals 1 to 2 K units with full recruitment. No fibrillations or positive waves were noted. The triceps muscle reveals 2 to 4 K units with full recruitment. No fibrillations or positive waves were noted. The anterior deltoid muscle reveals 2 to 3 K units with full recruitment. No fibrillations or positive waves were noted. The cervical paraspinal muscles were tested at 2 levels. No abnormalities of insertional activity were seen at either level tested. There was good relaxation.   IMPRESSION:  Nerve conduction studies done on the left upper extremity were within normal limits. No evidence of a neuropathy is seen. EMG evaluation of the left upper extremity is unremarkable,  without evidence of an overlying cervical radiculopathy.  Marlan Palau MD 01/25/2015 1:58 PM  [facility] [address] [place], [place] [address]  Phone [phone] Fax [phone]

## 2016-01-24 ENCOUNTER — Other Ambulatory Visit (INDEPENDENT_AMBULATORY_CARE_PROVIDER_SITE_OTHER): Payer: Self-pay

## 2016-01-24 ENCOUNTER — Encounter: Payer: Self-pay | Admitting: Internal Medicine

## 2016-01-24 ENCOUNTER — Ambulatory Visit (INDEPENDENT_AMBULATORY_CARE_PROVIDER_SITE_OTHER): Payer: Self-pay | Admitting: Internal Medicine

## 2016-01-24 VITALS — BP 110/68 | HR 69 | Temp 98.8°F | Resp 12 | Ht 62.0 in | Wt 132.4 lb

## 2016-01-24 DIAGNOSIS — Z0000 Encounter for general adult medical examination without abnormal findings: Secondary | ICD-10-CM

## 2016-01-24 LAB — COMPREHENSIVE METABOLIC PANEL
ALBUMIN: 4 g/dL (ref 3.5–5.2)
ALT: 15 U/L (ref 0–35)
AST: 13 U/L (ref 0–37)
Alkaline Phosphatase: 66 U/L (ref 39–117)
BUN: 8 mg/dL (ref 6–23)
CALCIUM: 9.3 mg/dL (ref 8.4–10.5)
CHLORIDE: 102 meq/L (ref 96–112)
CO2: 28 meq/L (ref 19–32)
Creatinine, Ser: 0.56 mg/dL (ref 0.40–1.20)
GFR: 124.52 mL/min (ref >60.00)
Glucose, Bld: 90 mg/dL (ref 70–99)
Potassium: 4.3 mEq/L (ref 3.5–5.1)
Sodium: 136 mEq/L (ref 135–145)
Total Bilirubin: 0.5 mg/dL (ref 0.2–1.2)
Total Protein: 7.9 g/dL (ref 6.0–8.3)

## 2016-01-24 LAB — LIPID PANEL
CHOL/HDL RATIO: 4
CHOLESTEROL: 165 mg/dL (ref 0–200)
HDL: 43.4 mg/dL (ref >39.00)
LDL CALC: 91 mg/dL (ref 0–99)
NonHDL: 121.48
TRIGLYCERIDES: 151 mg/dL — AB (ref 0.0–149.0)
VLDL: 30.2 mg/dL (ref 0.0–40.0)

## 2016-01-24 LAB — HEMOGLOBIN A1C: Hgb A1c MFr Bld: 5.4 % (ref 4.6–6.5)

## 2016-01-24 LAB — CBC
HEMATOCRIT: 38.3 % (ref 36.0–46.0)
HEMOGLOBIN: 12.9 g/dL (ref 12.0–15.0)
MCHC: 33.7 g/dL (ref 30.0–36.0)
MCV: 82.3 fl (ref 78.0–100.0)
Platelets: 306 10*3/uL (ref 150.0–400.0)
RBC: 4.65 Mil/uL (ref 3.87–5.11)
RDW: 14.5 % (ref 11.5–15.5)
WBC: 8.3 10*3/uL (ref 4.0–10.5)

## 2016-01-24 NOTE — Progress Notes (Signed)
Pre visit review using our clinic review tool, if applicable. No additional management support is needed unless otherwise documented below in the visit note. 

## 2016-01-24 NOTE — Patient Instructions (Signed)
Keep up the good work with the exercise.   We are checking the labs today and will call back with the results.   Health Maintenance, Female Adopting a healthy lifestyle and getting preventive care can go a long way to promote health and wellness. Talk with your health care provider about what schedule of regular examinations is right for you. This is a good chance for you to check in with your provider about disease prevention and staying healthy. In between checkups, there are plenty of things you can do on your own. Experts have done a lot of research about which lifestyle changes and preventive measures are most likely to keep you healthy. Ask your health care provider for more information. WEIGHT AND DIET  Eat a healthy diet  Be sure to include plenty of vegetables, fruits, low-fat dairy products, and lean protein.  Do not eat a lot of foods high in solid fats, added sugars, or salt.  Get regular exercise. This is one of the most important things you can do for your health.  Most adults should exercise for at least 150 minutes each week. The exercise should increase your heart rate and make you sweat (moderate-intensity exercise).  Most adults should also do strengthening exercises at least twice a week. This is in addition to the moderate-intensity exercise.  Maintain a healthy weight  Body mass index (BMI) is a measurement that can be used to identify possible weight problems. It estimates body fat based on height and weight. Your health care provider can help determine your BMI and help you achieve or maintain a healthy weight.  For females 1 years of age and older:   A BMI below 18.5 is considered underweight.  A BMI of 18.5 to 24.9 is normal.  A BMI of 25 to 29.9 is considered overweight.  A BMI of 30 and above is considered obese.  Watch levels of cholesterol and blood lipids  You should start having your blood tested for lipids and cholesterol at 45 years of age, then  have this test every 5 years.  You may need to have your cholesterol levels checked more often if:  Your lipid or cholesterol levels are high.  You are older than 45 years of age.  You are at high risk for heart disease.  CANCER SCREENING   Lung Cancer  Lung cancer screening is recommended for adults 85-51 years old who are at high risk for lung cancer because of a history of smoking.  A yearly low-dose CT scan of the lungs is recommended for people who:  Currently smoke.  Have quit within the past 15 years.  Have at least a 30-pack-year history of smoking. A pack year is smoking an average of one pack of cigarettes a day for 1 year.  Yearly screening should continue until it has been 15 years since you quit.  Yearly screening should stop if you develop a health problem that would prevent you from having lung cancer treatment.  Breast Cancer  Practice breast self-awareness. This means understanding how your breasts normally appear and feel.  It also means doing regular breast self-exams. Let your health care provider know about any changes, no matter how small.  If you are in your 20s or 30s, you should have a clinical breast exam (CBE) by a health care provider every 1-3 years as part of a regular health exam.  If you are 27 or older, have a CBE every year. Also consider having a breast X-ray (  mammogram) every year.  If you have a family history of breast cancer, talk to your health care provider about genetic screening.  If you are at high risk for breast cancer, talk to your health care provider about having an MRI and a mammogram every year.  Breast cancer gene (BRCA) assessment is recommended for women who have family members with BRCA-related cancers. BRCA-related cancers include:  Breast.  Ovarian.  Tubal.  Peritoneal cancers.  Results of the assessment will determine the need for genetic counseling and BRCA1 and BRCA2 testing. Cervical Cancer Your health  care provider may recommend that you be screened regularly for cancer of the pelvic organs (ovaries, uterus, and vagina). This screening involves a pelvic examination, including checking for microscopic changes to the surface of your cervix (Pap test). You may be encouraged to have this screening done every 3 years, beginning at age 31.  For women ages 22-65, health care providers may recommend pelvic exams and Pap testing every 3 years, or they may recommend the Pap and pelvic exam, combined with testing for human papilloma virus (HPV), every 5 years. Some types of HPV increase your risk of cervical cancer. Testing for HPV may also be done on women of any age with unclear Pap test results.  Other health care providers may not recommend any screening for nonpregnant women who are considered low risk for pelvic cancer and who do not have symptoms. Ask your health care provider if a screening pelvic exam is right for you.  If you have had past treatment for cervical cancer or a condition that could lead to cancer, you need Pap tests and screening for cancer for at least 20 years after your treatment. If Pap tests have been discontinued, your risk factors (such as having a new sexual partner) need to be reassessed to determine if screening should resume. Some women have medical problems that increase the chance of getting cervical cancer. In these cases, your health care provider may recommend more frequent screening and Pap tests. Colorectal Cancer  This type of cancer can be detected and often prevented.  Routine colorectal cancer screening usually begins at 45 years of age and continues through 45 years of age.  Your health care provider may recommend screening at an earlier age if you have risk factors for colon cancer.  Your health care provider may also recommend using home test kits to check for hidden blood in the stool.  A small camera at the end of a tube can be used to examine your colon  directly (sigmoidoscopy or colonoscopy). This is done to check for the earliest forms of colorectal cancer.  Routine screening usually begins at age 37.  Direct examination of the colon should be repeated every 5-10 years through 45 years of age. However, you may need to be screened more often if early forms of precancerous polyps or small growths are found. Skin Cancer  Check your skin from head to toe regularly.  Tell your health care provider about any new moles or changes in moles, especially if there is a change in a mole's shape or color.  Also tell your health care provider if you have a mole that is larger than the size of a pencil eraser.  Always use sunscreen. Apply sunscreen liberally and repeatedly throughout the day.  Protect yourself by wearing long sleeves, pants, a wide-brimmed hat, and sunglasses whenever you are outside. HEART DISEASE, DIABETES, AND HIGH BLOOD PRESSURE   High blood pressure causes heart disease  and increases the risk of stroke. High blood pressure is more likely to develop in:  People who have blood pressure in the high end of the normal range (130-139/85-89 mm Hg).  People who are overweight or obese.  People who are African American.  If you are 76-17 years of age, have your blood pressure checked every 3-5 years. If you are 62 years of age or older, have your blood pressure checked every year. You should have your blood pressure measured twice--once when you are at a hospital or clinic, and once when you are not at a hospital or clinic. Record the average of the two measurements. To check your blood pressure when you are not at a hospital or clinic, you can use:  An automated blood pressure machine at a pharmacy.  A home blood pressure monitor.  If you are between 70 years and 31 years old, ask your health care provider if you should take aspirin to prevent strokes.  Have regular diabetes screenings. This involves taking a blood sample to check  your fasting blood sugar level.  If you are at a normal weight and have a low risk for diabetes, have this test once every three years after 46 years of age.  If you are overweight and have a high risk for diabetes, consider being tested at a younger age or more often. PREVENTING INFECTION  Hepatitis B  If you have a higher risk for hepatitis B, you should be screened for this virus. You are considered at high risk for hepatitis B if:  You were born in a country where hepatitis B is common. Ask your health care provider which countries are considered high risk.  Your parents were born in a high-risk country, and you have not been immunized against hepatitis B (hepatitis B vaccine).  You have HIV or AIDS.  You use needles to inject street drugs.  You live with someone who has hepatitis B.  You have had sex with someone who has hepatitis B.  You get hemodialysis treatment.  You take certain medicines for conditions, including cancer, organ transplantation, and autoimmune conditions. Hepatitis C  Blood testing is recommended for:  Everyone born from [birth year] through 1965.  Anyone with known risk factors for hepatitis C. Sexually transmitted infections (STIs)  You should be screened for sexually transmitted infections (STIs) including gonorrhea and chlamydia if:  You are sexually active and are younger than 45 years of age.  You are older than 45 years of age and your health care provider tells you that you are at risk for this type of infection.  Your sexual activity has changed since you were last screened and you are at an increased risk for chlamydia or gonorrhea. Ask your health care provider if you are at risk.  If you do not have HIV, but are at risk, it may be recommended that you take a prescription medicine daily to prevent HIV infection. This is called pre-exposure prophylaxis (PrEP). You are considered at risk if:  You are sexually active and do not regularly use  condoms or know the HIV status of your partner(s).  You take drugs by injection.  You are sexually active with a partner who has HIV. Talk with your health care provider about whether you are at high risk of being infected with HIV. If you choose to begin PrEP, you should first be tested for HIV. You should then be tested every 3 months for as long as you are taking PrEP.  PREGNANCY   If you are premenopausal and you may become pregnant, ask your health care provider about preconception counseling.  If you may become pregnant, take 400 to 800 micrograms (mcg) of folic acid every day.  If you want to prevent pregnancy, talk to your health care provider about birth control (contraception). OSTEOPOROSIS AND MENOPAUSE   Osteoporosis is a disease in which the bones lose minerals and strength with aging. This can result in serious bone fractures. Your risk for osteoporosis can be identified using a bone density scan.  If you are 47 years of age or older, or if you are at risk for osteoporosis and fractures, ask your health care provider if you should be screened.  Ask your health care provider whether you should take a calcium or vitamin D supplement to lower your risk for osteoporosis.  Menopause may have certain physical symptoms and risks.  Hormone replacement therapy may reduce some of these symptoms and risks. Talk to your health care provider about whether hormone replacement therapy is right for you.  HOME CARE INSTRUCTIONS   Schedule regular health, dental, and eye exams.  Stay current with your immunizations.   Do not use any tobacco products including cigarettes, chewing tobacco, or electronic cigarettes.  If you are pregnant, do not drink alcohol.  If you are breastfeeding, limit how much and how often you drink alcohol.  Limit alcohol intake to no more than 1 drink per day for nonpregnant women. One drink equals 12 ounces of beer, 5 ounces of wine, or 1 ounces of hard  liquor.  Do not use street drugs.  Do not share needles.  Ask your health care provider for help if you need support or information about quitting drugs.  Tell your health care provider if you often feel depressed.  Tell your health care provider if you have ever been abused or do not feel safe at home.   This information is not intended to replace advice given to you by your health care provider. Make sure you discuss any questions you have with your health care provider.   Document Released: 02/12/2011 Document Revised: 08/20/2014 Document Reviewed: 07/01/2013 Elsevier Interactive Patient Education [employer].

## 2016-01-24 NOTE — Progress Notes (Signed)
   Subjective:    Patient ID: Kimberly Horne, female    DOB: January 31, 1971, 45 y.o.   MRN: [id number]  HPI The patient is a new 45 YO female coming in for wellness. Some old medical problems which are not bothering her now. She is doing well overall. Exercising 3-4 days per week, non-smoker.   [facility], [facility], social history reviewed and updated.   Review of Systems  Constitutional: Negative for fever, activity change, appetite change, fatigue and unexpected weight change.  HENT: Negative.   Eyes: Negative.   Respiratory: Negative for cough, chest tightness and shortness of breath.   Cardiovascular: Negative for chest pain, palpitations and leg swelling.  Gastrointestinal: Negative for abdominal pain, diarrhea, constipation and abdominal distention.  Musculoskeletal: Negative.   Skin: Negative.   Neurological: Negative.   Psychiatric/Behavioral: Negative.       Objective:   Physical Exam  Constitutional: She is oriented to person, place, and time. She appears well-developed and well-nourished.  HENT:  Head: Normocephalic and atraumatic.  Eyes: EOM are normal.  Neck: Normal range of motion.  Cardiovascular: Normal rate and regular rhythm.   Pulmonary/Chest: Effort normal and breath sounds normal. No respiratory distress. She has no wheezes. She has no rales.  Abdominal: Soft. Bowel sounds are normal. She exhibits no distension. There is no tenderness. There is no rebound.  Musculoskeletal: She exhibits no edema.  Neurological: She is alert and oriented to person, place, and time. Coordination normal.  Skin: Skin is warm and dry.  Psychiatric: She has a normal mood and affect.   Filed Vitals:   01/24/16 1404  BP: 110/68  Pulse: 69  Temp: 98.8 F (37.1 C)  TempSrc: Oral  Resp: 12  Height: 5\' 2"  (1.575 m)  Weight: 132 lb 6.4 oz (60.056 kg)  SpO2: 99%      Assessment & Plan:

## 2016-01-24 NOTE — Assessment & Plan Note (Signed)
Checking labs, no family history to suggest need for early colon cancer screening. Non-smoker and exercises regularly. Pap smear up to date along with tdap. Declines hiv screening need.

## 2016-03-27 ENCOUNTER — Ambulatory Visit: Payer: Self-pay | Admitting: Nurse Practitioner

## 2016-03-27 ENCOUNTER — Telehealth: Payer: Self-pay | Admitting: Internal Medicine

## 2016-03-27 NOTE — Telephone Encounter (Signed)
Patient no showed for acute visit 8/15.  Please advise.

## 2016-03-28 NOTE — Telephone Encounter (Signed)
Ok fine 

## 2016-04-17 ENCOUNTER — Encounter (HOSPITAL_COMMUNITY): Payer: Self-pay | Admitting: Emergency Medicine

## 2016-04-17 ENCOUNTER — Emergency Department (HOSPITAL_COMMUNITY): Payer: Self-pay

## 2016-04-17 ENCOUNTER — Emergency Department (HOSPITAL_COMMUNITY)
Admission: EM | Admit: 2016-04-17 | Discharge: 2016-04-17 | Disposition: A | Discharge status: Home / Self Care | Payer: Self-pay | Attending: Emergency Medicine | Admitting: Emergency Medicine

## 2016-04-17 ENCOUNTER — Ambulatory Visit: Payer: Self-pay | Admitting: Internal Medicine

## 2016-04-17 DIAGNOSIS — R102 Pelvic and perineal pain: Secondary | ICD-10-CM | POA: Insufficient documentation

## 2016-04-17 DIAGNOSIS — N201 Calculus of ureter: Secondary | ICD-10-CM | POA: Insufficient documentation

## 2016-04-17 LAB — COMPREHENSIVE METABOLIC PANEL
ALT: 18 U/L (ref 14–54)
AST: 20 U/L (ref 15–41)
Albumin: 3.6 g/dL (ref 3.5–5.0)
Alkaline Phosphatase: 69 U/L (ref 38–126)
Anion gap: 7 (ref 5–15)
BILIRUBIN TOTAL: 0.6 mg/dL (ref 0.3–1.2)
BUN: 7 mg/dL (ref 6–20)
CHLORIDE: 105 mmol/L (ref 101–111)
CO2: 21 mmol/L — ABNORMAL LOW (ref 22–32)
CREATININE: 0.7 mg/dL (ref 0.44–1.00)
Calcium: 8.6 mg/dL — ABNORMAL LOW (ref 8.9–10.3)
Glucose, Bld: 92 mg/dL (ref 65–99)
POTASSIUM: 3.7 mmol/L (ref 3.5–5.1)
Sodium: 133 mmol/L — ABNORMAL LOW (ref 135–145)
TOTAL PROTEIN: 7.1 g/dL (ref 6.5–8.1)

## 2016-04-17 LAB — CBC
HEMATOCRIT: 39.9 % (ref 36.0–46.0)
Hemoglobin: 12.7 g/dL (ref 12.0–15.0)
MCH: 27.3 pg (ref 26.0–34.0)
MCHC: 31.8 g/dL (ref 30.0–36.0)
MCV: 85.6 fL (ref 78.0–100.0)
PLATELETS: 239 10*3/uL (ref 150–400)
RBC: 4.66 MIL/uL (ref 3.87–5.11)
RDW: 14 % (ref 11.5–15.5)
WBC: 12.5 10*3/uL — AB (ref 4.0–10.5)

## 2016-04-17 LAB — I-STAT BETA HCG BLOOD, ED (MC, WL, AP ONLY)

## 2016-04-17 LAB — URINALYSIS, ROUTINE W REFLEX MICROSCOPIC
Bilirubin Urine: NEGATIVE
Glucose, UA: NEGATIVE mg/dL
KETONES UR: NEGATIVE mg/dL
LEUKOCYTES UA: NEGATIVE
NITRITE: NEGATIVE
PROTEIN: NEGATIVE mg/dL
Specific Gravity, Urine: 1.007 (ref 1.005–1.030)
pH: 7 (ref 5.0–8.0)

## 2016-04-17 LAB — WET PREP, GENITAL
Clue Cells Wet Prep HPF POC: NONE SEEN
Sperm: NONE SEEN
TRICH WET PREP: NONE SEEN
YEAST WET PREP: NONE SEEN

## 2016-04-17 LAB — URINE MICROSCOPIC-ADD ON

## 2016-04-17 LAB — LIPASE, BLOOD: Lipase: 24 U/L (ref 11–51)

## 2016-04-17 MED ORDER — ONDANSETRON HCL 4 MG/2ML IJ SOLN: 4.0000 mg | Freq: Once | INTRAMUSCULAR | Status: DC | PRN

## 2016-04-17 MED ORDER — HYDROCODONE-ACETAMINOPHEN 5-325 MG PO TABS: 1.0000 | ORAL_TABLET | Freq: Four times a day (QID) | ORAL | 0 refills | Status: DC | PRN

## 2016-04-17 MED ORDER — ONDANSETRON 4 MG PO TBDP: 4.0000 mg | ORAL_TABLET | Freq: Three times a day (TID) | ORAL | 0 refills | Status: DC | PRN

## 2016-04-17 MED ORDER — TAMSULOSIN HCL 0.4 MG PO CAPS: 0.4000 mg | ORAL_CAPSULE | Freq: Every day | ORAL | 0 refills | Status: DC

## 2016-04-17 MED ORDER — NAPROXEN 250 MG PO TABS: 250.0000 mg | ORAL_TABLET | Freq: Two times a day (BID) | ORAL | 0 refills | Status: DC

## 2016-04-17 NOTE — ED Provider Notes (Signed)
[facility]-EMERGENCY DEPT Provider Note   CSN: [id number] Arrival date & time: 04/17/16  1024     History   Chief Complaint Chief Complaint  Patient presents with  . Abdominal Pain    HPI Kimberly Horne is a 45 y.o. female.  Kimberly Horne is a 45 y.o. Female who presents to the emergency department complaining of sudden onset of bilateral lower abdominal pain since this morning. She reports it felt like bad menstrual cramping and was the same on both sides. It was not worse on one side than the other. She reports since arrival to the emergency department around 11 AM her pain has gradually resolved. She currently has no pain. She reports after the pain started she began to feel nauseated and had one episode of vomiting. She reports she no longer feels nauseated. No diarrhea. She does reports he's had urinary frequency for the past month. No other urinary symptoms. She's had a previous cholecystectomy. Last menstrual cycle was 03/25/2016. No treatments prior to arrival. Patient denies fevers, diarrhea, hematemesis, rashes, dysuria, hematuria, frequency of urination, vaginal bleeding, vaginal discharge, upper abdominal pain, coughing, chest pain or shortness of breath.   The history is provided by the patient. No language interpreter was used.  Abdominal Pain   Associated symptoms include nausea, vomiting and frequency. Pertinent negatives include fever, diarrhea, dysuria, hematuria and headaches.    History reviewed. No pertinent past medical history.  Patient Active Problem List   Diagnosis Date Noted  . Routine general medical examination at a health care facility 01/24/2016  . Myalgia and myositis 10/24/2007    Past Surgical History:  Procedure Laterality Date  . CHOLECYSTECTOMY  2002  . COCCYX REMOVAL  1997    OB History    No data available       Home Medications    Prior to Admission medications   Medication Sig Start Date End Date Taking? Authorizing Provider    HYDROcodone-acetaminophen (NORCO/VICODIN) 5-325 MG tablet Take 1-2 tablets by mouth every 6 (six) hours as needed for severe pain. 04/17/16   Everlene Farrier, PA-C  naproxen (NAPROSYN) 250 MG tablet Take 1 tablet (250 mg total) by mouth 2 (two) times daily with a meal. 04/17/16   Everlene Farrier, PA-C  ondansetron (ZOFRAN ODT) 4 MG disintegrating tablet Take 1 tablet (4 mg total) by mouth every 8 (eight) hours as needed for nausea or vomiting. 04/17/16   Everlene Farrier, PA-C  tamsulosin (FLOMAX) 0.4 MG CAPS capsule Take 1 capsule (0.4 mg total) by mouth daily. 04/17/16   Everlene Farrier, PA-C    Family History Family History  Problem Relation Age of Onset  . Hyperlipidemia Mother   . Hypertension Mother   . Diabetes Father     Social History Social History  Substance Use Topics  . Smoking status: Never Smoker  . Smokeless tobacco: Not on file  . Alcohol use Not on file     Allergies   Review of patient's allergies indicates no known allergies.   Review of Systems Review of Systems  Constitutional: Negative for chills and fever.  HENT: Negative for congestion and sore throat.   Eyes: Negative for visual disturbance.  Respiratory: Negative for cough and shortness of breath.   Cardiovascular: Negative for chest pain.  Gastrointestinal: Positive for abdominal pain, nausea and vomiting. Negative for blood in stool and diarrhea.  Genitourinary: Positive for frequency. Negative for decreased urine volume, difficulty urinating, dysuria, flank pain, hematuria, menstrual problem, pelvic pain, urgency, vaginal bleeding and  vaginal discharge.  Musculoskeletal: Negative for back pain and neck pain.  Skin: Negative for rash.  Neurological: Negative for syncope and headaches.     Physical Exam Updated Vital Signs BP 112/70   Pulse 77   Temp 98.4 F (36.9 C) (Oral)   Resp 16   SpO2 100%   Physical Exam  Constitutional: She appears well-developed and well-nourished. No distress.  Nontoxic  appearing.  HENT:  Head: Normocephalic and atraumatic.  Mouth/Throat: Oropharynx is clear and moist.  Eyes: Conjunctivae are normal. Pupils are equal, round, and reactive to light. Right eye exhibits no discharge. Left eye exhibits no discharge.  Neck: Neck supple.  Cardiovascular: Normal rate, regular rhythm, normal heart sounds and intact distal pulses.  Exam reveals no gallop and no friction rub.   No murmur heard. Pulmonary/Chest: Effort normal and breath sounds normal. No respiratory distress. She has no wheezes. She has no rales.  Abdominal: Soft. Bowel sounds are normal. She exhibits no distension and no mass. There is tenderness. There is no rebound and no guarding.  Abdomen is soft bowel sounds are present. Patient has bilateral lower abdominal tenderness to palpation. No peritoneal signs. No CVA or flank tenderness. No psoas or obturator sign.  Genitourinary: No vaginal discharge found.  Genitourinary Comments: Pelvic exam performed by me with female RN chaperone. No small lesions or rashes noted. No vaginal bleeding or vaginal discharge. Cervix is closed. No cervical motion tenderness. Patient does have bilateral adnexal tenderness which is worse on her right side.  Musculoskeletal: She exhibits no edema.  Lymphadenopathy:    She has no cervical adenopathy.  Neurological: She is alert. Coordination normal.  Skin: Skin is warm and dry. Capillary refill takes less than 2 seconds. No rash noted. She is not diaphoretic. No erythema. No pallor.  Psychiatric: She has a normal mood and affect. Her behavior is normal.  Nursing note and vitals reviewed.    ED Treatments / Results  Labs (all labs ordered are listed, but only abnormal results are displayed) Labs Reviewed  WET PREP, GENITAL - Abnormal; Notable for the following:       Result Value   WBC, Wet Prep HPF POC FEW (*)    All other components within normal limits  COMPREHENSIVE METABOLIC PANEL - Abnormal; Notable for the  following:    Sodium 133 (*)    CO2 21 (*)    Calcium 8.6 (*)    All other components within normal limits  CBC - Abnormal; Notable for the following:    WBC 12.5 (*)    All other components within normal limits  URINALYSIS, ROUTINE W REFLEX MICROSCOPIC (NOT AT [facility]) - Abnormal; Notable for the following:    Hgb urine dipstick MODERATE (*)    All other components within normal limits  URINE MICROSCOPIC-ADD ON - Abnormal; Notable for the following:    Squamous Epithelial / LPF 0-5 (*)    Bacteria, UA RARE (*)    All other components within normal limits  LIPASE, BLOOD  I-STAT BETA HCG BLOOD, ED ([facility], [facility], [facility] ONLY)  GC/CHLAMYDIA PROBE AMP ([place]) NOT AT [facility]    EKG  EKG Interpretation None       Radiology US Transvaginal Non-ob  Result Date: 04/17/2016 CLINICAL DATA:  Bilateral adnexal tenderness since early this morning. Onset of last normal menstrual period was March 25, 2016 EXAM: TRANSABDOMINAL AND TRANSVAGINAL ULTRASOUND OF PELVIS DOPPLER ULTRASOUND OF OVARIES TECHNIQUE: Both transabdominal and transvaginal ultrasound examinations of the pelvis were performed.  Transabdominal technique was performed for global imaging of the pelvis including uterus, ovaries, adnexal regions, and pelvic cul-de-sac. It was necessary to proceed with endovaginal exam following the transabdominal exam to visualize the ovaries. Color and duplex Doppler ultrasound was utilized to evaluate blood flow to the ovaries. COMPARISON:  Pelvic ultrasound of March 26, 2004. FINDINGS: Uterus Measurements: 8.9 x 4.6 x 5.3 cm. No fibroids or other mass visualized. Endometrium Thickness: 11.8 mm.  No focal abnormality visualized. Right ovary Measurements: 1.8 x 1.4 x 1.5 cm. Normal appearance/no adnexal mass. Left ovary Measurements: 3.0 x 1.8 x 2.5 cm. There is a dominant follicle on the left measuring 1.2 cm in greatest dimension. Pulsed Doppler evaluation of both ovaries demonstrates normal low-resistance arterial  and venous waveforms. Other findings There is no free pelvic fluid. Incidental note is made of a suspected calcification measuring up to 5 mm in diameter within a moderately dilated left ureter. IMPRESSION: 1. Normal appearance of the uterus and endometrium. 2. Normal appearance and vascularity of the ovaries. No adnexal masses or free pelvic fluid. 3. **An incidental finding of potential clinical significance has been found. There is a probable stone within a mildly dilated left ureter. This stone measures 5 x 3 x 4 mm and exhibits shadowing.** Electronically Signed   By: David  SwazilandJordan M.D.   On: 04/17/2016 14:28   [place] Pelvis Complete  Result Date: 04/17/2016 CLINICAL DATA:  Bilateral adnexal tenderness since early this morning. Onset of last normal menstrual period was March 25, 2016 EXAM: TRANSABDOMINAL AND TRANSVAGINAL ULTRASOUND OF PELVIS DOPPLER ULTRASOUND OF OVARIES TECHNIQUE: Both transabdominal and transvaginal ultrasound examinations of the pelvis were performed. Transabdominal technique was performed for global imaging of the pelvis including uterus, ovaries, adnexal regions, and pelvic cul-de-sac. It was necessary to proceed with endovaginal exam following the transabdominal exam to visualize the ovaries. Color and duplex Doppler ultrasound was utilized to evaluate blood flow to the ovaries. COMPARISON:  Pelvic ultrasound of March 26, 2004. FINDINGS: Uterus Measurements: 8.9 x 4.6 x 5.3 cm. No fibroids or other mass visualized. Endometrium Thickness: 11.8 mm.  No focal abnormality visualized. Right ovary Measurements: 1.8 x 1.4 x 1.5 cm. Normal appearance/no adnexal mass. Left ovary Measurements: 3.0 x 1.8 x 2.5 cm. There is a dominant follicle on the left measuring 1.2 cm in greatest dimension. Pulsed Doppler evaluation of both ovaries demonstrates normal low-resistance arterial and venous waveforms. Other findings There is no free pelvic fluid. Incidental note is made of a suspected calcification  measuring up to 5 mm in diameter within a moderately dilated left ureter. IMPRESSION: 1. Normal appearance of the uterus and endometrium. 2. Normal appearance and vascularity of the ovaries. No adnexal masses or free pelvic fluid. 3. **An incidental finding of potential clinical significance has been found. There is a probable stone within a mildly dilated left ureter. This stone measures 5 x 3 x 4 mm and exhibits shadowing.** Electronically Signed   By: David  [place] M.D.   On: 04/17/2016 14:28   [place] Art/ven Flow Abd Pelv Doppler  Result Date: 04/17/2016 CLINICAL DATA:  Bilateral adnexal tenderness since early this morning. Onset of last normal menstrual period was March 25, 2016 EXAM: TRANSABDOMINAL AND TRANSVAGINAL ULTRASOUND OF PELVIS DOPPLER ULTRASOUND OF OVARIES TECHNIQUE: Both transabdominal and transvaginal ultrasound examinations of the pelvis were performed. Transabdominal technique was performed for global imaging of the pelvis including uterus, ovaries, adnexal regions, and pelvic cul-de-sac. It was necessary to proceed with endovaginal exam following the transabdominal exam to  visualize the ovaries. Color and duplex Doppler ultrasound was utilized to evaluate blood flow to the ovaries. COMPARISON:  Pelvic ultrasound of March 26, 2004. FINDINGS: Uterus Measurements: 8.9 x 4.6 x 5.3 cm. No fibroids or other mass visualized. Endometrium Thickness: 11.8 mm.  No focal abnormality visualized. Right ovary Measurements: 1.8 x 1.4 x 1.5 cm. Normal appearance/no adnexal mass. Left ovary Measurements: 3.0 x 1.8 x 2.5 cm. There is a dominant follicle on the left measuring 1.2 cm in greatest dimension. Pulsed Doppler evaluation of both ovaries demonstrates normal low-resistance arterial and venous waveforms. Other findings There is no free pelvic fluid. Incidental note is made of a suspected calcification measuring up to 5 mm in diameter within a moderately dilated left ureter. IMPRESSION: 1. Normal appearance  of the uterus and endometrium. 2. Normal appearance and vascularity of the ovaries. No adnexal masses or free pelvic fluid. 3. **An incidental finding of potential clinical significance has been found. There is a probable stone within a mildly dilated left ureter. This stone measures 5 x 3 x 4 mm and exhibits shadowing.** Electronically Signed   By: David  [place] M.D.   On: 04/17/2016 14:28   Ct Renal Stone Study  Result Date: 04/17/2016 CLINICAL DATA:  Lower pelvic pain EXAM: CT ABDOMEN AND PELVIS WITHOUT CONTRAST TECHNIQUE: Multidetector CT imaging of the abdomen and pelvis was performed following the standard protocol without IV contrast. COMPARISON:  None FINDINGS: Lower chest: The lung bases are clear. No pleural or pericardial effusion identified. Hepatobiliary: No mass visualized on this un-enhanced exam. Pancreas: No mass or inflammatory process identified on this un-enhanced exam. Spleen: Within normal limits in size. Adrenals/Urinary Tract: Normal appearance of the adrenal glands. The right kidney appears normal. No mass or hydronephrosis. There is asymmetric left-sided nephro megaly and perinephric fat stranding. Left-sided hydroureter is identified. Stone within the distal left ureter measures 4 mm, image 74 of series 201. Stomach/Bowel: The stomach is within normal limits. The small bowel loops have a normal course and caliber. No obstruction. Normal appearance of the colon. Vascular/Lymphatic: Normal appearance of the abdominal aorta. No enlarged retroperitoneal or mesenteric adenopathy. No enlarged pelvic or inguinal lymph nodes. Reproductive: No mass or other significant abnormality. Other: None. Musculoskeletal: There is degenerative disc disease noted within the lower thoracic spine. IMPRESSION: 1. Distal left ureteral calculus measures 4 mm. Electronically Signed   By: Signa Kell M.D.   On: 04/17/2016 16:11    Procedures Procedures (including critical care time)  Medications Ordered  in ED Medications  ondansetron (ZOFRAN) injection 4 mg (not administered)     Initial Impression / Assessment and Plan / ED Course  I have reviewed the triage vital signs and the nursing notes.  Pertinent labs & imaging results that were available during my care of the patient were reviewed by me and considered in my medical decision making (see chart for details).  Clinical Course   This  is a 45 y.o. Female who presents to the emergency department complaining of sudden onset of bilateral lower abdominal pain since this morning. She reports it felt like bad menstrual cramping and was the same on both sides. It was not worse on one side than the other. She reports since arrival to the emergency department around 11 AM her pain has gradually resolved. She currently has no pain. She reports after the pain started she began to feel nauseated and had one episode of vomiting. She reports she no longer feels nauseated. No diarrhea. She does reports  he's had urinary frequency for the past month. No other urinary symptoms. She's had a previous cholecystectomy.  On exam the patient is afebrile and nontoxic appearing.  She is mild bilateral lower abdominal tenderness to palpation. No peritoneal signs. No CVA or flank tenderness. On pelvic exam no vaginal discharge. She has adnexal tenderness bilaterally that is worse on her right side. Pelvic ultrasound is ordered to rule out torsion or other abnormality. Wet prep is remarkable only for a few white blood cells Gonorrhea and Chlamydia testing are pending. Pregnancy test is negative. Urinalysis is remarkable only for moderate hemoglobin. CBC and CMP are unremarkable. Pelvic ultrasound shows normal appearance of the uterus and endometrium. No evidence of torsion. There is an incidental finding of a probable stone within a mildly dilated left ureter. Will obtain CT renal stone study. Patient still reports she is pain-free. CT renal stone study shows distal left  ureteral calculus measuring 4 mm. Patient denies history of kidney stones. She still reports she is pain-free. She's been pain-free since my initial evaluation. She is tolerating by mouth in the emergency department. We will discharge with prescription for naproxen, Flomax, Zofran and Norco for breakthrough pain if needed. I encouraged her to follow-up with her PCP and urology. I discussed strict and specific return precautions. I advised the patient to follow-up with their primary care provider this week. I advised the patient to return to the emergency department with new or worsening symptoms or new concerns. The patient verbalized understanding and agreement with plan.      Final Clinical Impressions(s) / ED Diagnoses   Final diagnoses:  Adnexal pain  Left ureteral calculus    New Prescriptions New Prescriptions   HYDROCODONE-ACETAMINOPHEN (NORCO/VICODIN) 5-325 MG TABLET    Take 1-2 tablets by mouth every 6 (six) hours as needed for severe pain.   NAPROXEN (NAPROSYN) 250 MG TABLET    Take 1 tablet (250 mg total) by mouth 2 (two) times daily with a meal.   ONDANSETRON (ZOFRAN ODT) 4 MG DISINTEGRATING TABLET    Take 1 tablet (4 mg total) by mouth every 8 (eight) hours as needed for nausea or vomiting.   TAMSULOSIN (FLOMAX) 0.4 MG CAPS CAPSULE    Take 1 capsule (0.4 mg total) by mouth daily.     Everlene Farrier, PA-C 04/17/16 1640    Cathren Laine, MD 04/18/16 [phone]

## 2016-04-17 NOTE — ED Triage Notes (Addendum)
Lower abd since thhis am vomitted x1 pain comes and goes has appointment at 1 pm with her dr but could not wait

## 2016-04-18 LAB — GC/CHLAMYDIA PROBE AMP (~~LOC~~) NOT AT ARMC
Chlamydia: NEGATIVE
Neisseria Gonorrhea: NEGATIVE

## 2016-04-27 ENCOUNTER — Other Ambulatory Visit: Payer: Self-pay

## 2016-04-27 ENCOUNTER — Encounter: Payer: Self-pay | Admitting: Nurse Practitioner

## 2016-04-27 ENCOUNTER — Ambulatory Visit (INDEPENDENT_AMBULATORY_CARE_PROVIDER_SITE_OTHER): Payer: Self-pay | Admitting: Nurse Practitioner

## 2016-04-27 VITALS — BP 112/86 | HR 90 | Temp 98.0°F | Ht 61.0 in | Wt 132.0 lb

## 2016-04-27 DIAGNOSIS — B952 Enterococcus as the cause of diseases classified elsewhere: Secondary | ICD-10-CM

## 2016-04-27 DIAGNOSIS — R350 Frequency of micturition: Secondary | ICD-10-CM

## 2016-04-27 DIAGNOSIS — R300 Dysuria: Secondary | ICD-10-CM

## 2016-04-27 DIAGNOSIS — N390 Urinary tract infection, site not specified: Secondary | ICD-10-CM

## 2016-04-27 LAB — POCT URINALYSIS DIPSTICK
BILIRUBIN UA: NEGATIVE
Blood, UA: POSITIVE
Glucose, UA: NEGATIVE
Ketones, UA: NEGATIVE
LEUKOCYTES UA: NEGATIVE
NITRITE UA: NEGATIVE
PH UA: 7.5
PROTEIN UA: NEGATIVE
Spec Grav, UA: 1.02
Urobilinogen, UA: 0.2

## 2016-04-27 LAB — POCT URINE PREGNANCY: PREG TEST UR: NEGATIVE

## 2016-04-27 MED ORDER — NITROFURANTOIN MONOHYD MACRO 100 MG PO CAPS: 100.0000 mg | ORAL_CAPSULE | Freq: Two times a day (BID) | ORAL | 0 refills | Status: DC

## 2016-04-27 NOTE — Progress Notes (Signed)
Pre visit review using our clinic review tool, if applicable. No additional management support is needed unless otherwise documented below in the visit note. 

## 2016-04-27 NOTE — Patient Instructions (Addendum)
Will call with urine culture results. Continue adequate oral hydration. May also use AZO OTC for urinary pain.  Kegel Exercises The goal of Kegel exercises is to isolate and exercise your pelvic floor muscles. These muscles act as a hammock that supports the rectum, vagina, small intestine, and uterus. As the muscles weaken, the hammock sags and these organs are displaced from their normal positions. Kegel exercises can strengthen your pelvic floor muscles and help you to improve bladder and bowel control, improve sexual response, and help reduce many problems and some discomfort during pregnancy. Kegel exercises can be done anywhere and at any time. HOW TO PERFORM KEGEL EXERCISES 1. Locate your pelvic floor muscles. To do this, squeeze (contract) the muscles that you use when you try to stop the flow of urine. You will feel a tightness in the vaginal area (women) and a tight lift in the rectal area (men and women). 2. When you begin, contract your pelvic muscles tight for 2-5 seconds, then relax them for 2-5 seconds. This is one set. Do 4-5 sets with a short pause in between. 3. Contract your pelvic muscles for 8-10 seconds, then relax them for 8-10 seconds. Do 4-5 sets. If you cannot contract your pelvic muscles for 8-10 seconds, try 5-7 seconds and work your way up to 8-10 seconds. Your goal is 4-5 sets of 10 contractions each day. Keep your stomach, buttocks, and legs relaxed during the exercises. Perform sets of both short and long contractions. Vary your positions. Perform these contractions 3-4 times per day. Perform sets while you are:   Lying in bed in the morning.  Standing at lunch.  Sitting in the late afternoon.  Lying in bed at night. You should do 40-50 contractions per day. Do not perform more Kegel exercises per day than recommended. Overexercising can cause muscle fatigue. Continue these exercises for for at least 15-20 weeks or as directed by your caregiver.   This  information is not intended to replace advice given to you by your health care provider. Make sure you discuss any questions you have with your health care provider.   Document Released: 07/16/2012 Document Revised: 08/20/2014 Document Reviewed: 07/16/2012 Elsevier Interactive Patient Education 2016 [employer] Inc[employer]  Urinary Tract Infection Urinary tract infections (UTIs) can develop anywhere along your urinary tract. Your urinary tract is your body's drainage system for removing wastes and extra water. Your urinary tract includes two kidneys, two ureters, a bladder, and a urethra. Your kidneys are a pair of bean-shaped organs. Each kidney is about the size of your fist. They are located below your ribs, one on each side of your spine. CAUSES Infections are caused by microbes, which are microscopic organisms, including fungi, viruses, and bacteria. These organisms are so small that they can only be seen through a microscope. Bacteria are the microbes that most commonly cause UTIs. SYMPTOMS  Symptoms of UTIs may vary by age and gender of the patient and by the location of the infection. Symptoms in young women typically include a frequent and intense urge to urinate and a painful, burning feeling in the bladder or urethra during urination. Older women and men are more likely to be tired, shaky, and weak and have muscle aches and abdominal pain. A fever may mean the infection is in your kidneys. Other symptoms of a kidney infection include pain in your back or sides below the ribs, nausea, and vomiting. DIAGNOSIS To diagnose a UTI, your caregiver will ask you about your symptoms. Your caregiver  will also ask you to provide a urine sample. The urine sample will be tested for bacteria and white blood cells. White blood cells are made by your body to help fight infection. TREATMENT  Typically, UTIs can be treated with medication. Because most UTIs are caused by a bacterial infection, they usually can be  treated with the use of antibiotics. The choice of antibiotic and length of treatment depend on your symptoms and the type of bacteria causing your infection. HOME CARE INSTRUCTIONS  If you were prescribed antibiotics, take them exactly as your caregiver instructs you. Finish the medication even if you feel better after you have only taken some of the medication.  Drink enough water and fluids to keep your urine clear or pale yellow.  Avoid caffeine, tea, and carbonated beverages. They tend to irritate your bladder.  Empty your bladder often. Avoid holding urine for long periods of time.  Empty your bladder before and after sexual intercourse.  After a bowel movement, women should cleanse from front to back. Use each tissue only once. SEEK MEDICAL CARE IF:   You have back pain.  You develop a fever.  Your symptoms do not begin to resolve within 3 days. SEEK IMMEDIATE MEDICAL CARE IF:   You have severe back pain or lower abdominal pain.  You develop chills.  You have nausea or vomiting.  You have continued burning or discomfort with urination. MAKE SURE YOU:   Understand these instructions.  Will watch your condition.  Will get help right away if you are not doing well or get worse.   This information is not intended to replace advice given to you by your health care provider. Make sure you discuss any questions you have with your health care provider.   Document Released: 05/09/2005 Document Revised: 04/20/2015 Document Reviewed: 09/07/2011 Elsevier Interactive Patient Education [employer]2016 [employer] Inc.

## 2016-04-27 NOTE — Progress Notes (Signed)
Subjective:  Patient ID: Kimberly Horne, female    DOB: Dec 17, 1970  Age: 45 y.o. MRN: [id number]  CC: Urinary Tract Infection (pt stated  having pain when urinating started 3 days ago.)   Urinary Tract Infection   This is a new problem. The current episode started in the past 7 days. The problem occurs every urination. The problem has been waxing and waning. The quality of the pain is described as burning. The pain is at a severity of 7/10. The pain is severe. There has been no fever. She is sexually active. There is no history of pyelonephritis. Associated symptoms include frequency, a possible pregnancy and urgency. Pertinent negatives include no chills, discharge, flank pain, hematuria, hesitancy or nausea. She has tried increased fluids and acetaminophen for the symptoms. The treatment provided no relief. Her past medical history is significant for kidney stones. There is no history of catheterization, recurrent UTIs or urinary stasis.  Sexually active with husband, no use of any birth control. Reports shorter menstrual cycle this month.  Reviewed ED discharge summary, labs and imaging.  Outpatient Medications Prior to Visit  Medication Sig Dispense Refill  . HYDROcodone-acetaminophen (NORCO/VICODIN) 5-325 MG tablet Take 1-2 tablets by mouth every 6 (six) hours as needed for severe pain. 10 tablet 0  . naproxen (NAPROSYN) 250 MG tablet Take 1 tablet (250 mg total) by mouth 2 (two) times daily with a meal. 30 tablet 0  . ondansetron (ZOFRAN ODT) 4 MG disintegrating tablet Take 1 tablet (4 mg total) by mouth every 8 (eight) hours as needed for nausea or vomiting. (Patient not taking: Reported on 04/27/2016) 10 tablet 0  . tamsulosin (FLOMAX) 0.4 MG CAPS capsule Take 1 capsule (0.4 mg total) by mouth daily. (Patient not taking: Reported on 04/27/2016) 10 capsule 0   No facility-administered medications prior to visit.     ROS See HPI  Reviewed lab results from ED visit: CT urogram,  transvaginal ultrasound, wet prep, and Ua.  Objective:  BP 112/86 (BP Location: Left Arm, Patient Position: Sitting, Cuff Size: Normal)   Pulse 90   Temp 98 F (36.7 C)   Ht 5\' 1"  (1.549 m)   Wt 132 lb 0.6 oz (59.9 kg)   SpO2 91%   BMI 24.95 kg/m   BP Readings from Last 3 Encounters:  04/27/16 112/86  04/17/16 117/78  01/24/16 110/68    Wt Readings from Last 3 Encounters:  04/27/16 132 lb 0.6 oz (59.9 kg)  01/24/16 132 lb 6.4 oz (60.1 kg)  04/21/09 126 lb (57.2 kg)    Physical Exam  Constitutional: She is oriented to person, place, and time. No distress.  Cardiovascular: Normal rate.   Pulmonary/Chest: Effort normal.  Abdominal: Soft. Bowel sounds are normal. She exhibits no distension. There is no tenderness.  Musculoskeletal: She exhibits no edema.  Neurological: She is alert and oriented to person, place, and time.  Skin: Skin is warm and dry.  Psychiatric: She has a normal mood and affect. Her behavior is normal.  Vitals reviewed.   Lab Results  Component Value Date   WBC 12.5 (H) 04/17/2016   HGB 12.7 04/17/2016   HCT 39.9 04/17/2016   PLT 239 04/17/2016   GLUCOSE 92 04/17/2016   CHOL 165 01/24/2016   TRIG 151.0 (H) 01/24/2016   HDL 43.40 01/24/2016   LDLCALC 91 01/24/2016   ALT 18 04/17/2016   AST 20 04/17/2016   NA 133 (L) 04/17/2016   K 3.7 04/17/2016   CL 105  04/17/2016   CREATININE 0.70 04/17/2016   BUN 7 04/17/2016   CO2 21 (L) 04/17/2016   TSH 2.03 05/12/2009   HGBA1C 5.4 01/24/2016    US Transvaginal Non-ob  Result Date: 04/17/2016 CLINICAL DATA:  Bilateral adnexal tenderness since early this morning. Onset of last normal menstrual period was March 25, 2016 EXAM: TRANSABDOMINAL AND TRANSVAGINAL ULTRASOUND OF PELVIS DOPPLER ULTRASOUND OF OVARIES TECHNIQUE: Both transabdominal and transvaginal ultrasound examinations of the pelvis were performed. Transabdominal technique was performed for global imaging of the pelvis including uterus, ovaries,  adnexal regions, and pelvic cul-de-sac. It was necessary to proceed with endovaginal exam following the transabdominal exam to visualize the ovaries. Color and duplex Doppler ultrasound was utilized to evaluate blood flow to the ovaries. COMPARISON:  Pelvic ultrasound of March 26, 2004. FINDINGS: Uterus Measurements: 8.9 x 4.6 x 5.3 cm. No fibroids or other mass visualized. Endometrium Thickness: 11.8 mm.  No focal abnormality visualized. Right ovary Measurements: 1.8 x 1.4 x 1.5 cm. Normal appearance/no adnexal mass. Left ovary Measurements: 3.0 x 1.8 x 2.5 cm. There is a dominant follicle on the left measuring 1.2 cm in greatest dimension. Pulsed Doppler evaluation of both ovaries demonstrates normal low-resistance arterial and venous waveforms. Other findings There is no free pelvic fluid. Incidental note is made of a suspected calcification measuring up to 5 mm in diameter within a moderately dilated left ureter. IMPRESSION: 1. Normal appearance of the uterus and endometrium. 2. Normal appearance and vascularity of the ovaries. No adnexal masses or free pelvic fluid. 3. **An incidental finding of potential clinical significance has been found. There is a probable stone within a mildly dilated left ureter. This stone measures 5 x 3 x 4 mm and exhibits shadowing.** Electronically Signed   By: David  [place] M.D.   On: 04/17/2016 14:28   US Pelvis Complete  Result Date: 04/17/2016 CLINICAL DATA:  Bilateral adnexal tenderness since early this morning. Onset of last normal menstrual period was March 25, 2016 EXAM: TRANSABDOMINAL AND TRANSVAGINAL ULTRASOUND OF PELVIS DOPPLER ULTRASOUND OF OVARIES TECHNIQUE: Both transabdominal and transvaginal ultrasound examinations of the pelvis were performed. Transabdominal technique was performed for global imaging of the pelvis including uterus, ovaries, adnexal regions, and pelvic cul-de-sac. It was necessary to proceed with endovaginal exam following the transabdominal  exam to visualize the ovaries. Color and duplex Doppler ultrasound was utilized to evaluate blood flow to the ovaries. COMPARISON:  Pelvic ultrasound of March 26, 2004. FINDINGS: Uterus Measurements: 8.9 x 4.6 x 5.3 cm. No fibroids or other mass visualized. Endometrium Thickness: 11.8 mm.  No focal abnormality visualized. Right ovary Measurements: 1.8 x 1.4 x 1.5 cm. Normal appearance/no adnexal mass. Left ovary Measurements: 3.0 x 1.8 x 2.5 cm. There is a dominant follicle on the left measuring 1.2 cm in greatest dimension. Pulsed Doppler evaluation of both ovaries demonstrates normal low-resistance arterial and venous waveforms. Other findings There is no free pelvic fluid. Incidental note is made of a suspected calcification measuring up to 5 mm in diameter within a moderately dilated left ureter. IMPRESSION: 1. Normal appearance of the uterus and endometrium. 2. Normal appearance and vascularity of the ovaries. No adnexal masses or free pelvic fluid. 3. **An incidental finding of potential clinical significance has been found. There is a probable stone within a mildly dilated left ureter. This stone measures 5 x 3 x 4 mm and exhibits shadowing.** Electronically Signed   By: David  [place] M.D.   On: 04/17/2016 14:28   [place] Art/ven Flow Abd  Pelv Doppler  Result Date: 04/17/2016 CLINICAL DATA:  Bilateral adnexal tenderness since early this morning. Onset of last normal menstrual period was March 25, 2016 EXAM: TRANSABDOMINAL AND TRANSVAGINAL ULTRASOUND OF PELVIS DOPPLER ULTRASOUND OF OVARIES TECHNIQUE: Both transabdominal and transvaginal ultrasound examinations of the pelvis were performed. Transabdominal technique was performed for global imaging of the pelvis including uterus, ovaries, adnexal regions, and pelvic cul-de-sac. It was necessary to proceed with endovaginal exam following the transabdominal exam to visualize the ovaries. Color and duplex Doppler ultrasound was utilized to evaluate blood flow to  the ovaries. COMPARISON:  Pelvic ultrasound of March 26, 2004. FINDINGS: Uterus Measurements: 8.9 x 4.6 x 5.3 cm. No fibroids or other mass visualized. Endometrium Thickness: 11.8 mm.  No focal abnormality visualized. Right ovary Measurements: 1.8 x 1.4 x 1.5 cm. Normal appearance/no adnexal mass. Left ovary Measurements: 3.0 x 1.8 x 2.5 cm. There is a dominant follicle on the left measuring 1.2 cm in greatest dimension. Pulsed Doppler evaluation of both ovaries demonstrates normal low-resistance arterial and venous waveforms. Other findings There is no free pelvic fluid. Incidental note is made of a suspected calcification measuring up to 5 mm in diameter within a moderately dilated left ureter. IMPRESSION: 1. Normal appearance of the uterus and endometrium. 2. Normal appearance and vascularity of the ovaries. No adnexal masses or free pelvic fluid. 3. **An incidental finding of potential clinical significance has been found. There is a probable stone within a mildly dilated left ureter. This stone measures 5 x 3 x 4 mm and exhibits shadowing.** Electronically Signed   By: David  [place] M.D.   On: 04/17/2016 14:28   Ct Renal Stone Study  Result Date: 04/17/2016 CLINICAL DATA:  Lower pelvic pain EXAM: CT ABDOMEN AND PELVIS WITHOUT CONTRAST TECHNIQUE: Multidetector CT imaging of the abdomen and pelvis was performed following the standard protocol without IV contrast. COMPARISON:  None FINDINGS: Lower chest: The lung bases are clear. No pleural or pericardial effusion identified. Hepatobiliary: No mass visualized on this un-enhanced exam. Pancreas: No mass or inflammatory process identified on this un-enhanced exam. Spleen: Within normal limits in size. Adrenals/Urinary Tract: Normal appearance of the adrenal glands. The right kidney appears normal. No mass or hydronephrosis. There is asymmetric left-sided nephro megaly and perinephric fat stranding. Left-sided hydroureter is identified. Stone within the distal  left ureter measures 4 mm, image 74 of series 201. Stomach/Bowel: The stomach is within normal limits. The small bowel loops have a normal course and caliber. No obstruction. Normal appearance of the colon. Vascular/Lymphatic: Normal appearance of the abdominal aorta. No enlarged retroperitoneal or mesenteric adenopathy. No enlarged pelvic or inguinal lymph nodes. Reproductive: No mass or other significant abnormality. Other: None. Musculoskeletal: There is degenerative disc disease noted within the lower thoracic spine. IMPRESSION: 1. Distal left ureteral calculus measures 4 mm. Electronically Signed   By: Signa Kellaylor  Stroud M.D.   On: 04/17/2016 16:11    Assessment & Plan:   Kimberly Horne was seen today for urinary tract infection.  Diagnoses and all orders for this visit:  Dysuria -     nitrofurantoin, macrocrystal-monohydrate, (MACROBID) 100 MG capsule; Take 1 capsule (100 mg total) by mouth 2 (two) times daily. -     POCT urine pregnancy -     POCT Urinalysis Dipstick -     Urine culture; Future  Increased urinary frequency -     nitrofurantoin, macrocrystal-monohydrate, (MACROBID) 100 MG capsule; Take 1 capsule (100 mg total) by mouth 2 (two) times daily. -  POCT urine pregnancy -     POCT Urinalysis Dipstick -     Urine culture; Future   I have discontinued Ms. Schwier's ondansetron, naproxen, tamsulosin, and HYDROcodone-acetaminophen. I am also having her start on nitrofurantoin (macrocrystal-monohydrate).  Meds ordered this encounter  Medications  . nitrofurantoin, macrocrystal-monohydrate, (MACROBID) 100 MG capsule    Sig: Take 1 capsule (100 mg total) by mouth 2 (two) times daily.    Dispense:  6 capsule    Refill:  0    Order Specific Question:   Supervising Provider    Answer:   Tresa Garter [[id number]]    Follow-up: Return if symptoms worsen or fail to improve.  Alysia Penna, NP

## 2016-04-27 NOTE — Progress Notes (Signed)
Reviewed with patient in office. See office note

## 2016-05-01 ENCOUNTER — Encounter: Payer: Self-pay | Admitting: Nurse Practitioner

## 2016-05-01 LAB — URINE CULTURE

## 2016-05-01 MED ORDER — CIPROFLOXACIN HCL 250 MG PO TABS: 250.0000 mg | ORAL_TABLET | Freq: Two times a day (BID) | ORAL | 0 refills | Status: DC

## 2016-05-01 NOTE — Progress Notes (Signed)
abnormal, Inform patient that urine is positive for bacteria growth. Sen sent to Cipro prescription to cover UTI. Start immediately and complete as prescribed. Call office for another appointment if no improvement after completion of treatment.

## 2016-08-15 ENCOUNTER — Other Ambulatory Visit: Payer: Self-pay | Admitting: Obstetrics and Gynecology

## 2016-08-15 DIAGNOSIS — R928 Other abnormal and inconclusive findings on diagnostic imaging of breast: Secondary | ICD-10-CM

## 2016-08-20 ENCOUNTER — Ambulatory Visit
Admission: RE | Admit: 2016-08-20 | Discharge: 2016-08-20 | Disposition: A | Discharge status: Home / Self Care | Payer: No Typology Code available for payment source | Source: Ambulatory Visit | Attending: Obstetrics and Gynecology | Admitting: Obstetrics and Gynecology

## 2016-08-20 DIAGNOSIS — R928 Other abnormal and inconclusive findings on diagnostic imaging of breast: Secondary | ICD-10-CM

## 2017-02-06 ENCOUNTER — Ambulatory Visit (INDEPENDENT_AMBULATORY_CARE_PROVIDER_SITE_OTHER): Payer: Self-pay | Admitting: Neurology

## 2017-02-06 ENCOUNTER — Encounter: Payer: Self-pay | Admitting: Neurology

## 2017-02-06 ENCOUNTER — Ambulatory Visit (INDEPENDENT_AMBULATORY_CARE_PROVIDER_SITE_OTHER): Payer: BLUE CROSS/BLUE SHIELD | Admitting: Neurology

## 2017-02-06 DIAGNOSIS — Z0289 Encounter for other administrative examinations: Secondary | ICD-10-CM

## 2017-02-06 DIAGNOSIS — M79602 Pain in left arm: Secondary | ICD-10-CM

## 2017-02-06 NOTE — Procedures (Signed)
     HISTORY:  Kimberly Horne is a 46 year old patient with a two-year history of left neck and shoulder discomfort and pain down the left arm. The patient is being evaluated for this discomfort.  NERVE CONDUCTION STUDIES:  Nerve conduction studies were performed on the left upper extremity. The distal motor latencies and motor amplitudes for the median and ulnar nerves were within normal limits. The nerve conduction velocities for these nerves were also normal. The sensory latencies for the median, radial, and ulnar nerves were normal. The F wave latencies for the median and ulnar nerves were normal.   EMG STUDIES:  EMG study was performed on the left upper extremity:  The first dorsal interosseous muscle reveals 2 to 4 K units with full recruitment. No fibrillations or positive waves were noted. The abductor pollicis brevis muscle reveals 2 to 4 K units with full recruitment. No fibrillations or positive waves were noted. The extensor indicis proprius muscle reveals 1 to 3 K units with full recruitment. No fibrillations or positive waves were noted. The pronator teres muscle reveals 2 to 3 K units with full recruitment. No fibrillations or positive waves were noted. The biceps muscle reveals 1 to 2 K units with full recruitment. No fibrillations or positive waves were noted. The triceps muscle reveals 2 to 4 K units with full recruitment. No fibrillations or positive waves were noted. The anterior deltoid muscle reveals 2 to 3 K units with full recruitment. No fibrillations or positive waves were noted. The cervical paraspinal muscles were tested at 2 levels. No abnormalities of insertional activity were seen at either level tested. There was good relaxation.   IMPRESSION:  Nerve conduction studies done on the left upper extremity were unremarkable, no evidence of a neuropathy is seen. EMG evaluation of the left upper extremity is normal without evidence of an overlying cervical  radiculopathy.  Marlan Palau. Keith Willis MD 02/06/2017 3:18 PM  [facility] [address] [place], [place] [id number]  Phone [phone] Fax [phone]

## 2017-02-06 NOTE — Progress Notes (Signed)
Please refer to EMG and nerve conduction study procedure note. 

## 2017-10-15 ENCOUNTER — Other Ambulatory Visit: Payer: Self-pay | Admitting: Obstetrics and Gynecology

## 2017-10-15 DIAGNOSIS — Z1231 Encounter for screening mammogram for malignant neoplasm of breast: Secondary | ICD-10-CM

## 2017-10-16 ENCOUNTER — Ambulatory Visit
Admission: RE | Admit: 2017-10-16 | Discharge: 2017-10-16 | Disposition: A | Discharge status: Home / Self Care | Payer: 59 | Source: Ambulatory Visit | Attending: Obstetrics and Gynecology | Admitting: Obstetrics and Gynecology

## 2017-10-16 DIAGNOSIS — Z1231 Encounter for screening mammogram for malignant neoplasm of breast: Secondary | ICD-10-CM | POA: Diagnosis not present

## 2017-10-24 ENCOUNTER — Other Ambulatory Visit (INDEPENDENT_AMBULATORY_CARE_PROVIDER_SITE_OTHER): Payer: 59

## 2017-10-24 ENCOUNTER — Encounter: Payer: Self-pay | Admitting: Internal Medicine

## 2017-10-24 ENCOUNTER — Ambulatory Visit: Payer: 59 | Admitting: Internal Medicine

## 2017-10-24 VITALS — BP 110/70 | HR 88 | Temp 97.4°F | Ht 61.0 in | Wt 136.0 lb

## 2017-10-24 DIAGNOSIS — R1032 Left lower quadrant pain: Secondary | ICD-10-CM | POA: Diagnosis not present

## 2017-10-24 DIAGNOSIS — Z0000 Encounter for general adult medical examination without abnormal findings: Secondary | ICD-10-CM | POA: Diagnosis not present

## 2017-10-24 LAB — CBC
HCT: 37.6 % (ref 36.0–46.0)
HEMOGLOBIN: 12.4 g/dL (ref 12.0–15.0)
MCHC: 33.1 g/dL (ref 30.0–36.0)
MCV: 81.7 fl (ref 78.0–100.0)
Platelets: 299 10*3/uL (ref 150.0–400.0)
RBC: 4.6 Mil/uL (ref 3.87–5.11)
RDW: 14.9 % (ref 11.5–15.5)
WBC: 11.1 10*3/uL — AB (ref 4.0–10.5)

## 2017-10-24 LAB — COMPREHENSIVE METABOLIC PANEL
ALT: 22 U/L (ref 0–35)
AST: 16 U/L (ref 0–37)
Albumin: 3.8 g/dL (ref 3.5–5.2)
Alkaline Phosphatase: 73 U/L (ref 39–117)
BILIRUBIN TOTAL: 0.4 mg/dL (ref 0.2–1.2)
BUN: 8 mg/dL (ref 6–23)
CO2: 31 mEq/L (ref 19–32)
CREATININE: 0.53 mg/dL (ref 0.40–1.20)
Calcium: 9.1 mg/dL (ref 8.4–10.5)
Chloride: 102 mEq/L (ref 96–112)
GFR: 131.66 mL/min (ref >60.00)
GLUCOSE: 104 mg/dL — AB (ref 70–99)
Potassium: 3.6 mEq/L (ref 3.5–5.1)
SODIUM: 137 meq/L (ref 135–145)
Total Protein: 7.4 g/dL (ref 6.0–8.3)

## 2017-10-24 LAB — HEMOGLOBIN A1C: Hgb A1c MFr Bld: 5.5 % (ref 4.6–6.5)

## 2017-10-24 LAB — VITAMIN B12: Vitamin B-12: 414 pg/mL (ref 211–911)

## 2017-10-24 LAB — LIPASE: LIPASE: 17 U/L (ref 11.0–59.0)

## 2017-10-24 LAB — LIPID PANEL
CHOL/HDL RATIO: 4
Cholesterol: 161 mg/dL (ref 0–200)
HDL: 42.4 mg/dL (ref >39.00)
NONHDL: 118.47
TRIGLYCERIDES: 244 mg/dL — AB (ref 0.0–149.0)
VLDL: 48.8 mg/dL — ABNORMAL HIGH (ref 0.0–40.0)

## 2017-10-24 LAB — VITAMIN D 25 HYDROXY (VIT D DEFICIENCY, FRACTURES): VITD: 10.11 ng/mL — ABNORMAL LOW (ref 30.00–100.00)

## 2017-10-24 LAB — TSH: TSH: 4.28 u[IU]/mL (ref 0.35–4.50)

## 2017-10-24 LAB — LDL CHOLESTEROL, DIRECT: Direct LDL: 107 mg/dL

## 2017-10-24 NOTE — Progress Notes (Signed)
   Subjective:    Patient ID: Kimberly Horne, female    DOB: 27-Jun-1971, 47 y.o.   MRN: [id number]  HPI The patient is a 47 YO female coming in for llq abdominal pain. She has had pain similar to this in the past which was related to ovulation. She denies it being this bad in the past. She has not taken anything for it. Started last night. Overall stable since onset. 6/10 pain. She denies blood in stool, diarrhea, constipation, nausea or vomiting. She denies change in diet. She denies vaginal bleeding or discharge and gets routine exams. She is ovulating this week. Kidney stone in the past which felt different than this.   Review of Systems  Constitutional: Negative.   HENT: Negative.   Eyes: Negative.   Respiratory: Negative for cough, chest tightness and shortness of breath.   Cardiovascular: Negative for chest pain, palpitations and leg swelling.  Gastrointestinal: Positive for abdominal pain. Negative for abdominal distention, constipation, diarrhea, nausea and vomiting.  Genitourinary: Negative for decreased urine volume, difficulty urinating, flank pain, frequency, pelvic pain, vaginal bleeding and vaginal pain.  Musculoskeletal: Negative.   Skin: Negative.   Neurological: Negative.   Psychiatric/Behavioral: Negative.       Objective:   Physical Exam Vitals:   10/24/17 1323  BP: 110/70  Pulse: 88  Temp: (!) 97.4 F (36.3 C)  TempSrc: Oral  SpO2: 99%  Weight: 136 lb (61.7 kg)  Height: 5\' 1"  (1.549 m)      Assessment & Plan:

## 2017-10-24 NOTE — Assessment & Plan Note (Signed)
Checking CBC, CMP, lipase. Suspect ovarian cyst. Take ibuprofen for pain.

## 2017-10-24 NOTE — Patient Instructions (Signed)
We will have you try ibuprofen for the pain and it should go away in 2-3 days.   We will check the labs today.    Ovarian Cyst An ovarian cyst is a fluid-filled sac on an ovary. The ovaries are organs that make eggs in women. Most ovarian cysts go away on their own and are not cancerous (are benign). Some cysts need treatment. Follow these instructions at home:  Take over-the-counter and prescription medicines only as told by your doctor.  Do not drive or use heavy machinery while taking prescription pain medicine.  Get pelvic exams and Pap tests as often as told by your doctor.  Return to your normal activities as told by your doctor. Ask your doctor what activities are safe for you.  Do not use any products that contain nicotine or tobacco, such as cigarettes and e-cigarettes. If you need help quitting, ask your doctor.  Keep all follow-up visits as told by your doctor. This is important. Contact a doctor if:  Your periods are: ? Late. ? Irregular. ? Painful.  Your periods stop.  You have pelvic pain that does not go away.  You have pressure on your bladder.  You have trouble making your bladder empty when you pee (urinate).  You have pain during sex.  You have any of the following in your belly (abdomen): ? A feeling of fullness. ? Pressure. ? Discomfort. ? Pain that does not go away. ? Swelling.  You feel sick most of the time.  You have trouble pooping (have constipation).  You are not as hungry as usual (you lose your appetite).  You get very bad acne.  You start to have more hair on your body and face.  You are gaining weight or losing weight without changing your exercise and eating habits.  You think you may be pregnant. Get help right away if:  You have belly pain that is very bad or gets worse.  You cannot eat or drink without throwing up (vomiting).  You suddenly get a fever.  Your period is a lot heavier than usual. This information is  not intended to replace advice given to you by your health care provider. Make sure you discuss any questions you have with your health care provider. Document Released: 01/16/2008 Document Revised: 02/17/2016 Document Reviewed: 01/01/2016 Elsevier Interactive Patient Education  [employer]2018 [employer] Inc[employer]

## 2017-11-20 DIAGNOSIS — Z23 Encounter for immunization: Secondary | ICD-10-CM | POA: Diagnosis not present

## 2017-12-05 ENCOUNTER — Telehealth: Payer: Self-pay | Admitting: Internal Medicine

## 2017-12-05 NOTE — Telephone Encounter (Signed)
Patient has dropped off a form to be filled out for a job. She was last seen on 10/24/17.  They are asking about her hearing, vision, lifting & Immunizations. She has brought a copy of them, she states it did not look up to date on her Mychart.   Please advise if she needs an appointment.

## 2017-12-06 NOTE — Telephone Encounter (Signed)
I do not have any papers in my box.

## 2017-12-06 NOTE — Telephone Encounter (Signed)
Patient is coming in for a nurse visit on 5/1 to have the TB test. Thank you.

## 2017-12-06 NOTE — Telephone Encounter (Signed)
She does not have recent TB test so if needed for employment can get done at office. Otherwise forms filled out.

## 2017-12-06 NOTE — Telephone Encounter (Signed)
I have placed the forms on your desk.

## 2017-12-11 ENCOUNTER — Ambulatory Visit (INDEPENDENT_AMBULATORY_CARE_PROVIDER_SITE_OTHER): Payer: 59

## 2017-12-11 DIAGNOSIS — Z23 Encounter for immunization: Secondary | ICD-10-CM

## 2017-12-11 NOTE — Progress Notes (Signed)
Medical screening examination/treatment/procedure(s) were performed by non-physician practitioner and as supervising physician I was immediately available for consultation/collaboration. I agree with above. Brandy Kabat, MD   

## 2017-12-23 ENCOUNTER — Encounter: Payer: Self-pay | Admitting: Internal Medicine

## 2017-12-23 NOTE — Telephone Encounter (Signed)
Patient requesting through mychart.  Please advise.

## 2018-02-19 ENCOUNTER — Ambulatory Visit: Payer: 59 | Admitting: Nurse Practitioner

## 2018-02-19 ENCOUNTER — Encounter: Payer: Self-pay | Admitting: Nurse Practitioner

## 2018-02-19 VITALS — BP 112/78 | HR 114 | Temp 98.9°F | Resp 16 | Ht 61.0 in | Wt 134.0 lb

## 2018-02-19 DIAGNOSIS — J029 Acute pharyngitis, unspecified: Secondary | ICD-10-CM

## 2018-02-19 DIAGNOSIS — J069 Acute upper respiratory infection, unspecified: Secondary | ICD-10-CM

## 2018-02-19 LAB — POCT RAPID STREP A (OFFICE): RAPID STREP A SCREEN: NEGATIVE

## 2018-02-19 MED ORDER — FLUTICASONE PROPIONATE 50 MCG/ACT NA SUSP
1.0000 | Freq: Every day | NASAL | 1 refills | Status: DC
Start: 2018-02-19 — End: 2020-05-09

## 2018-02-19 NOTE — Patient Instructions (Addendum)
I have sent a prescription for flonase nasal spray to your pharmacy- you may start with 2 sprays in each nostril daily then reduce to 1 spray in each nostril daily when your symptoms improve Saline nasal spray used frequently. For drainage may use Allegra, Claritin or Zyrtec. If you need stronger medicine to stop drainage may take Chlor-Trimeton 2-4 mg every 4 hours. This may cause drowsiness. Ibuprofen 600 mg every 6 hours as needed for pain, discomfort or fever. Drink plenty of fluids and stay well-hydrated.  Upper Respiratory Infection, Adult Most upper respiratory infections (URIs) are caused by a virus. A URI affects the nose, throat, and upper air passages. The most common type of URI is often called "the common cold." Follow these instructions at home:  Take medicines only as told by your doctor.  Gargle warm saltwater or take cough drops to comfort your throat as told by your doctor.  Use a warm mist humidifier or inhale steam from a shower to increase air moisture. This may make it easier to breathe.  Drink enough fluid to keep your pee (urine) clear or pale yellow.  Eat soups and other clear broths.  Have a healthy diet.  Rest as needed.  Go back to work when your fever is gone or your doctor says it is okay. ? You may need to stay home longer to avoid giving your URI to others. ? You can also wear a face mask and wash your hands often to prevent spread of the virus.  Use your inhaler more if you have asthma.  Do not use any tobacco products, including cigarettes, chewing tobacco, or electronic cigarettes. If you need help quitting, ask your doctor. Contact a doctor if:  You are getting worse, not better.  Your symptoms are not helped by medicine.  You have chills.  You are getting more short of breath.  You have brown or red mucus.  You have yellow or brown discharge from your nose.  You have pain in your face, especially when you bend forward.  You have a  fever.  You have puffy (swollen) neck glands.  You have pain while swallowing.  You have white areas in the back of your throat. Get help right away if:  You have very bad or constant: ? Headache. ? Ear pain. ? Pain in your forehead, behind your eyes, and over your cheekbones (sinus pain). ? Chest pain.  You have long-lasting (chronic) lung disease and any of the following: ? Wheezing. ? Long-lasting cough. ? Coughing up blood. ? A change in your usual mucus.  You have a stiff neck.  You have changes in your: ? Vision. ? Hearing. ? Thinking. ? Mood. This information is not intended to replace advice given to you by your health care provider. Make sure you discuss any questions you have with your health care provider. Document Released: 01/16/2008 Document Revised: 04/01/2016 Document Reviewed: 11/04/2013 Elsevier Interactive Patient Education  2018 [employer]

## 2018-02-19 NOTE — Progress Notes (Signed)
Name: Kimberly Horne   MRN: [id number]    DOB: 1970/08/23   Date:02/19/2018       Progress Note  Subjective  Chief Complaint  Chief Complaint  Patient presents with  . Sore Throat    sore throat, headache, ear ache, sinus congestion and chills, started on monday night     HPI  Kimberly Horne is here today for evaluation of acute complaint of cold symptoms. Her symptoms began about 2 days ago,on Monday night, feeling progressively worse since. She reports headache, ear pressure, nasal congestion, rhinorrhea, sore and itchy throat. She denies syncope, weakness, fevers, chills, chest pain, shortness of breath, cough, nausea, vomiting, abdominal pain, diarrhea. She reports eating and drinking okay, although it hurts to swallow. She tried an expired pill for cold symptoms at home yesterday, but says it made her feel worse and she has not tried anything else for her symptoms. She is concerned that she may have strep throat. Her daughter has been ill with similar symptoms but getting better.  Patient Active Problem List   Diagnosis Date Noted  . LLQ pain 10/24/2017  . Routine general medical examination at a health care facility 01/24/2016    Social History   Tobacco Use  . Smoking status: Never Smoker  . Smokeless tobacco: Never Used  Substance Use Topics  . Alcohol use: Not on file    No current outpatient medications on file.  No Known Allergies  ROS  No other specific complaints in a complete review of systems (except as listed in HPI above).  Objective  Vitals:   02/19/18 1420  BP: 112/78  Pulse: (!) 114  Resp: 16  Temp: 98.9 F (37.2 C)  TempSrc: Oral  SpO2: 98%  Weight: 134 lb (60.8 kg)  Height: 5\' 1"  (1.549 m)   Body mass index is 25.32 kg/m.  Nursing Note and Vital Signs reviewed.  Physical Exam  Constitutional: Patient appears well-developed and well-nourished.  No distress.  HEENT: head atraumatic, normocephalic, pupils equal and reactive to light,  EOM's intact, TM's without erythema or bulging, no maxillary or frontal sinus tenderness , neck supple without lymphadenopathy, oropharynx pink and moist without exudate Cardiovascular: Normal rate, normal heart sounds.  Distal pulses intact. Pulmonary/Chest: Effort normal and breath sounds clear. No respiratory distress or retractions. Musculoskeletal: Normal range of motion, No gross deformities Neurological: She is alert and oriented to person, place, and time. No cranial nerve deficit. Coordination, balance, strength, speech and gait are normal.  Skin: Skin is warm and dry. No rash noted. No erythema.  Psychiatric: Patient has a normal mood and affect. behavior is normal. Judgment and thought content normal.   Assessment & Plan  1. Upper respiratory tract infection, unspecified type - POCT rapid strep A-negative -home management of URI symptoms, Red flags and when to present for emergency care or RTC including fever >101.53F, chest pain, shortness of breath, new/worsening/un-resolving symptoms, with patient at time of visit. Follow up and care instructions discussed and provided in AVS. - fluticasone (FLONASE) 50 MCG/ACT nasal spray; Place 1 spray into both nostrils daily.  Dispense: 16 g; Refill: 1

## 2018-02-21 LAB — GLUCOSE, POCT (MANUAL RESULT ENTRY): POC Glucose: 113 mg/dl — AB (ref 70–99)

## 2018-03-12 DIAGNOSIS — Z23 Encounter for immunization: Secondary | ICD-10-CM | POA: Diagnosis not present

## 2018-10-13 ENCOUNTER — Other Ambulatory Visit: Payer: Self-pay | Admitting: Obstetrics and Gynecology

## 2018-10-13 DIAGNOSIS — Z1231 Encounter for screening mammogram for malignant neoplasm of breast: Secondary | ICD-10-CM

## 2018-11-10 ENCOUNTER — Inpatient Hospital Stay: Admission: RE | Admit: 2018-11-10 | Payer: 59 | Source: Ambulatory Visit

## 2018-12-23 ENCOUNTER — Ambulatory Visit
Admission: RE | Admit: 2018-12-23 | Discharge: 2018-12-23 | Disposition: A | Discharge status: Home / Self Care | Payer: 59 | Source: Ambulatory Visit | Attending: Obstetrics and Gynecology | Admitting: Obstetrics and Gynecology

## 2018-12-23 ENCOUNTER — Other Ambulatory Visit: Payer: Self-pay

## 2018-12-23 DIAGNOSIS — Z1231 Encounter for screening mammogram for malignant neoplasm of breast: Secondary | ICD-10-CM | POA: Diagnosis not present

## 2018-12-25 ENCOUNTER — Other Ambulatory Visit: Payer: Self-pay | Admitting: Obstetrics and Gynecology

## 2018-12-25 DIAGNOSIS — R928 Other abnormal and inconclusive findings on diagnostic imaging of breast: Secondary | ICD-10-CM

## 2018-12-30 ENCOUNTER — Ambulatory Visit: Payer: 59

## 2019-01-02 ENCOUNTER — Ambulatory Visit
Admission: RE | Admit: 2019-01-02 | Discharge: 2019-01-02 | Disposition: A | Discharge status: Home / Self Care | Payer: 59 | Source: Ambulatory Visit | Attending: Obstetrics and Gynecology | Admitting: Obstetrics and Gynecology

## 2019-01-02 ENCOUNTER — Other Ambulatory Visit: Payer: Self-pay

## 2019-01-02 ENCOUNTER — Other Ambulatory Visit: Payer: Self-pay | Admitting: Obstetrics and Gynecology

## 2019-01-02 DIAGNOSIS — R928 Other abnormal and inconclusive findings on diagnostic imaging of breast: Secondary | ICD-10-CM

## 2019-01-02 DIAGNOSIS — N6320 Unspecified lump in the left breast, unspecified quadrant: Secondary | ICD-10-CM

## 2019-01-02 DIAGNOSIS — N6489 Other specified disorders of breast: Secondary | ICD-10-CM

## 2019-02-27 ENCOUNTER — Encounter: Payer: Self-pay | Admitting: Internal Medicine

## 2019-03-04 ENCOUNTER — Other Ambulatory Visit: Payer: Self-pay

## 2019-03-04 ENCOUNTER — Other Ambulatory Visit (INDEPENDENT_AMBULATORY_CARE_PROVIDER_SITE_OTHER): Payer: 59

## 2019-03-04 ENCOUNTER — Ambulatory Visit (INDEPENDENT_AMBULATORY_CARE_PROVIDER_SITE_OTHER): Payer: 59 | Admitting: Family

## 2019-03-04 ENCOUNTER — Encounter: Payer: Self-pay | Admitting: Family

## 2019-03-04 VITALS — BP 114/78 | HR 100 | Temp 98.4°F | Ht 61.0 in | Wt 136.2 lb

## 2019-03-04 DIAGNOSIS — M7989 Other specified soft tissue disorders: Secondary | ICD-10-CM

## 2019-03-04 DIAGNOSIS — I7300 Raynaud's syndrome without gangrene: Secondary | ICD-10-CM

## 2019-03-04 DIAGNOSIS — M791 Myalgia, unspecified site: Secondary | ICD-10-CM

## 2019-03-04 LAB — CBC WITH DIFFERENTIAL/PLATELET
Basophils Absolute: 0 10*3/uL (ref 0.0–0.1)
Basophils Relative: 0.5 % (ref 0.0–3.0)
Eosinophils Absolute: 0.2 10*3/uL (ref 0.0–0.7)
Eosinophils Relative: 3.4 % (ref 0.0–5.0)
HCT: 38.5 % (ref 36.0–46.0)
Hemoglobin: 12.9 g/dL (ref 12.0–15.0)
Lymphocytes Relative: 27.9 % (ref 12.0–46.0)
Lymphs Abs: 2 10*3/uL (ref 0.7–4.0)
MCHC: 33.6 g/dL (ref 30.0–36.0)
MCV: 83.2 fl (ref 78.0–100.0)
Monocytes Absolute: 0.5 10*3/uL (ref 0.1–1.0)
Monocytes Relative: 7.5 % (ref 3.0–12.0)
Neutro Abs: 4.5 10*3/uL (ref 1.4–7.7)
Neutrophils Relative %: 60.7 % (ref 43.0–77.0)
Platelets: 286 10*3/uL (ref 150.0–400.0)
RBC: 4.63 Mil/uL (ref 3.87–5.11)
RDW: 14.9 % (ref 11.5–15.5)
WBC: 7.3 10*3/uL (ref 4.0–10.5)

## 2019-03-04 LAB — COMPREHENSIVE METABOLIC PANEL
ALT: 13 U/L (ref 0–35)
AST: 13 U/L (ref 0–37)
Albumin: 4 g/dL (ref 3.5–5.2)
Alkaline Phosphatase: 74 U/L (ref 39–117)
BUN: 8 mg/dL (ref 6–23)
CO2: 27 mEq/L (ref 19–32)
Calcium: 9.1 mg/dL (ref 8.4–10.5)
Chloride: 103 mEq/L (ref 96–112)
Creatinine, Ser: 0.57 mg/dL (ref 0.40–1.20)
GFR: 113.23 mL/min (ref >60.00)
Glucose, Bld: 101 mg/dL — ABNORMAL HIGH (ref 70–99)
Potassium: 3.7 mEq/L (ref 3.5–5.1)
Sodium: 137 mEq/L (ref 135–145)
Total Bilirubin: 0.4 mg/dL (ref 0.2–1.2)
Total Protein: 7.5 g/dL (ref 6.0–8.3)

## 2019-03-04 LAB — TSH: TSH: 3.81 u[IU]/mL (ref 0.35–4.50)

## 2019-03-04 LAB — VITAMIN D 25 HYDROXY (VIT D DEFICIENCY, FRACTURES): VITD: 16.47 ng/mL — ABNORMAL LOW (ref 30.00–100.00)

## 2019-03-04 LAB — VITAMIN B12: Vitamin B-12: 373 pg/mL (ref 211–911)

## 2019-03-04 LAB — SEDIMENTATION RATE: Sed Rate: 45 mm/hr — ABNORMAL HIGH (ref 0–20)

## 2019-03-04 NOTE — Progress Notes (Signed)
Kimberly Horne is a 48 y.o. female with the following history as recorded in [employer]:  Patient Active Problem List   Diagnosis Date Noted  . LLQ pain 10/24/2017  . Routine general medical examination at a health care facility 01/24/2016    Current Outpatient Medications  Medication Sig Dispense Refill  . fluticasone (FLONASE) 50 MCG/ACT nasal spray Place 1 spray into both nostrils daily. (Patient not taking: Reported on 03/04/2019) 16 g 1   No current facility-administered medications for this visit.     Allergies: Patient has no known allergies.  History reviewed. No pertinent past medical history.  Past Surgical History:  Procedure Laterality Date  . CHOLECYSTECTOMY  2002  . COCCYX REMOVAL  1997    Family History  Problem Relation Age of Onset  . Hyperlipidemia Mother   . Hypertension Mother   . Diabetes Father     Social History   Tobacco Use  . Smoking status: Never Smoker  . Smokeless tobacco: Never Used  Substance Use Topics  . Alcohol use: Not on file    Subjective:  Known history of Raynaud's syndrome; concerned about increased symptoms of swelling in fingers- seems worse in the past few weeks; increased aches, pains in legs; +FH of RA- maternal grandmother, maternal aunt; notes she was tested 5+ years ago and arthritis was ruled out; notes that when she wakes up in the am- her hands do swell and it can take a few hours to "normalize."   LMP- now    Objective:  Vitals:   03/04/19 0951  BP: 114/78  Pulse: 100  Temp: 98.4 F (36.9 C)  TempSrc: Oral  SpO2: 98%  Weight: 136 lb 3.2 oz (61.8 kg)  Height: _0  (1.549 m)    General: Well developed, well nourished, in no acute distress  Skin : Warm and dry.  Head: Normocephalic and atraumatic  Eyes: Sclera and conjunctiva clear; pupils round and reactive to light; extraocular movements intact  Ears: External normal; canals clear; tympanic membranes normal  Oropharynx: Pink, supple. No suspicious  lesions  Neck: Supple without thyromegaly, adenopathy  Lungs: Respirations unlabored;  Musculoskeletal: No deformities; no active joint inflammation  Extremities: No edema, cyanosis, clubbing  Vessels: Symmetric bilaterally  Neurologic: Alert and oriented; speech intact; face symmetrical; moves all extremities well;   Assessment:  1. Myalgia   2. Swelling of both hands   3. Raynaud's disease without gangrene     Plan:  ? Vasculitis vs RA vs fibromyalgia; update labs today; follow-up to be determined; May need vascular and/or rheumatology consult;  No follow-ups on file.  Orders Placed This Encounter  Procedures  . CBC w/Diff    Standing Status:   Future    Standing Expiration Date:   03/03/2020  . Comp Met (CMET)    Standing Status:   Future    Standing Expiration Date:   03/03/2020  . TSH    Standing Status:   Future    Standing Expiration Date:   03/03/2020  . Antinuclear Antib (ANA)    Standing Status:   Future    Standing Expiration Date:   03/03/2020  . Rheumatoid Factor    Standing Status:   Future    Standing Expiration Date:   03/03/2020  . Sed Rate (ESR)    Standing Status:   Future    Standing Expiration Date:   03/03/2020  . B12    Standing Status:   Future    Standing Expiration Date:   03/03/2020  .  Vitamin D (25 hydroxy)    Standing Status:   Future    Standing Expiration Date:   03/03/2020    Requested Prescriptions    No prescriptions requested or ordered in this encounter

## 2019-03-06 ENCOUNTER — Other Ambulatory Visit: Payer: Self-pay | Admitting: Family

## 2019-03-06 DIAGNOSIS — M7989 Other specified soft tissue disorders: Secondary | ICD-10-CM

## 2019-03-06 DIAGNOSIS — M791 Myalgia, unspecified site: Secondary | ICD-10-CM

## 2019-03-06 DIAGNOSIS — R700 Elevated erythrocyte sedimentation rate: Secondary | ICD-10-CM

## 2019-03-06 LAB — ANA: Anti Nuclear Antibody (ANA): POSITIVE — AB

## 2019-03-06 LAB — ANTI-NUCLEAR AB-TITER (ANA TITER): ANA Titer 1: 1:80 {titer} — ABNORMAL HIGH

## 2019-03-06 LAB — RHEUMATOID FACTOR: Rheumatoid fact SerPl-aCnc: <14 IU/mL (ref <14)

## 2019-03-06 MED ORDER — VITAMIN D (ERGOCALCIFEROL) 1.25 MG (50000 UNIT) PO CAPS: 50000.0000 [IU] | ORAL_CAPSULE | ORAL | 0 refills | Status: AC

## 2019-04-06 ENCOUNTER — Telehealth: Payer: Self-pay

## 2019-04-06 NOTE — Telephone Encounter (Signed)
Copied from [place] [id number]. Topic: General - Other >> Apr 02, 2019  3:13 PM Leward Quan A wrote: Reason for CRM: Patient called request to speak to Dr Nathanial Millman nurse about a form that she said she should have on file regarding her TB skin test. Per patient she had the test and it shows positive on her arm but states that she was told she should not have to take this test again. Asking for a call back at Ph# [phone]

## 2019-04-07 NOTE — Telephone Encounter (Signed)
Left message asking patient to call back and ask to be transferred to [place] office, talk with tamara,RN

## 2019-04-22 NOTE — Progress Notes (Signed)
Office Visit Note  Patient: Kimberly Horne             Date of Birth: 1971/06/24           MRN: [id number]             PCP: Myrlene Brokerrawford, Elizabeth A, MD Referring: Olive BassMurray, Laura Woodruff,* Visit Date: 05/06/2019 Occupation: Veterinary surgeonpanish instructor at [employer]  Subjective:  Pain in joints, Raynauds and positive ANA   History of Present Illness: Kimberly Horne is a 48 y.o. female seen in consultation per request of her PCP.  According to patient last summer she started noticing swelling in her hands which will come and go.  In the spring 2020 she states her fingers are very swollen and painful at night.  She states in April 2020 she went to her primary care physician who did lab work and found that she had vitamin D deficiency and also had some other abnormal labs.  She was given some vitamin D supplement and she states since July 2020 she has not had any swelling in her hands.  Although she continues to have intermittent pain and discomfort in her hands and her feet.  She states in 2002 she was diagnosed with Raynolds phenomenon.  She still has discoloration in her hands when exposed to the colder temperature.  She has chronic neck and lower back pain which she relates to a scoliosis.  None of the other joints are painful.  There is positive family history of rheumatoid arthritis on maternal side.  Activities of Daily Living:  Patient reports morning stiffness for 20 minutes.   Patient Reports nocturnal pain.  Difficulty dressing/grooming: Denies Difficulty climbing stairs: Denies Difficulty getting out of chair: Reports Difficulty using hands for taps, buttons, cutlery, and/or writing: Denies  Review of Systems  Constitutional: Negative for fatigue, night sweats, weight gain and weight loss.  HENT: Negative for mouth sores, trouble swallowing, trouble swallowing, mouth dryness and nose dryness.   Eyes: Negative for pain, redness, itching, visual disturbance and dryness.   Respiratory: Negative for cough, shortness of breath, wheezing and difficulty breathing.   Cardiovascular: Negative for chest pain, palpitations, hypertension, irregular heartbeat and swelling in legs/feet.  Gastrointestinal: Negative for abdominal pain, blood in stool, constipation and diarrhea.  Endocrine: Positive for cold intolerance. Negative for increased urination.  Genitourinary: Negative for difficulty urinating, painful urination and vaginal dryness.  Musculoskeletal: Positive for arthralgias, joint pain, joint swelling and morning stiffness. Negative for myalgias, muscle weakness, muscle tenderness and myalgias.  Skin: Positive for color change. Negative for rash, hair loss, skin tightness, ulcers and sensitivity to sunlight.  Allergic/Immunologic: Negative for susceptible to infections.  Neurological: Positive for numbness. Negative for dizziness, light-headedness, headaches, memory loss, night sweats and weakness.  Hematological: Negative for bruising/bleeding tendency and swollen glands.  Psychiatric/Behavioral: Positive for sleep disturbance. Negative for depressed mood and confusion. The patient is not nervous/anxious.     PMFS History:  Patient Active Problem List   Diagnosis Date Noted  . LLQ pain 10/24/2017  . Routine general medical examination at a health care facility 01/24/2016    History reviewed. No pertinent past medical history.  Family History  Problem Relation Age of Onset  . Hyperlipidemia Mother   . Hypertension Mother   . Diabetes Father   . Healthy Sister   . Healthy Brother   . Healthy Brother   . Healthy Daughter   . Healthy Daughter    Past Surgical History:  Procedure Laterality Date  .  CHOLECYSTECTOMY  2002  . COCCYX REMOVAL  1997  . HAND SURGERY Right    cyst removed   Social History   Social History Narrative  . Not on file   Immunization History  Administered Date(s) Administered  . PPD Test 12/11/2017  . Tdap 02/24/2013      Objective: Vital Signs: BP 120/79 (BP Location: Left Arm, Patient Position: Sitting, Cuff Size: Normal)   Pulse 65   Resp 12   Ht 5\' 1"  (1.549 m)   Wt 133 lb (60.3 kg)   BMI 25.13 kg/m    Physical Exam Vitals signs and nursing note reviewed.  Constitutional:      Appearance: She is well-developed.  HENT:     Head: Normocephalic and atraumatic.  Eyes:     Conjunctiva/sclera: Conjunctivae normal.  Neck:     Musculoskeletal: Normal range of motion.  Cardiovascular:     Rate and Rhythm: Normal rate and regular rhythm.     Heart sounds: Normal heart sounds.  Pulmonary:     Effort: Pulmonary effort is normal.     Breath sounds: Normal breath sounds.  Abdominal:     General: Bowel sounds are normal.     Palpations: Abdomen is soft.  Lymphadenopathy:     Cervical: No cervical adenopathy.  Skin:    General: Skin is warm and dry.     Capillary Refill: Capillary refill takes less than 2 seconds.  Neurological:     Mental Status: She is alert and oriented to person, place, and time.  Psychiatric:        Behavior: Behavior normal.      Musculoskeletal Exam: C-spine thoracic and lumbar spine with good range of motion.  Shoulder joints elbow joints wrist joint MCPs PIPs DIPs been good range of motion with no synovitis.  Hip joints knee joints ankles MTPs PIPs with good range of motion with no synovitis.  CDAI Exam: CDAI Score: - Patient Global: -; Provider Global: - Swollen: -; Tender: - Joint Exam   No joint exam has been documented for this visit   There is currently no information documented on the homunculus. Go to the Rheumatology activity and complete the homunculus joint exam.  Investigation: Findings:  03/04/19: ANA 1:80 cytoplasmic, RF<14, sed rate 45, Vitamin D 16.47, Vitamin B12 373, TSH 3.81  Component     Latest Ref Rng & Units 03/04/2019  VITD     30.00 - 100.00 ng/mL 16.47 (L)  Vitamin B12     211 - 911 pg/mL 373  Sed Rate     0 - 20 mm/hr 45 (H)  RA  Latex Turbid.     <14 IU/mL <14  Anti Nuclear Antibody (ANA)     NEGATIVE POSITIVE (A)  TSH     0.35 - 4.50 uIU/mL 3.81   Component     Latest Ref Rng & Units 03/04/2019  ANA Titer 1     titer 1:80 (H)  ANA Pattern 1      Cytoplasmic (A)   Imaging: No results found.  Recent Labs: Lab Results  Component Value Date   WBC 7.3 03/04/2019   HGB 12.9 03/04/2019   PLT 286.0 03/04/2019   NA 137 03/04/2019   K 3.7 03/04/2019   CL 103 03/04/2019   CO2 27 03/04/2019   GLUCOSE 101 (H) 03/04/2019   BUN 8 03/04/2019   CREATININE 0.57 03/04/2019   BILITOT 0.4 03/04/2019   ALKPHOS 74 03/04/2019   AST 13 03/04/2019   ALT  13 03/04/2019   PROT 7.5 03/04/2019   ALBUMIN 4.0 03/04/2019   CALCIUM 9.1 03/04/2019   GFRAA >60 04/17/2016    Speciality Comments: No specialty comments available.  Procedures:  No procedures performed Allergies: Patient has no known allergies.   Assessment / Plan:     Visit Diagnoses: Pain in both hands -change continues to have discomfort in her hands.  She states she has been having episodic swelling until July.  She states since she started taking vitamin D she has not noticed any swelling in her hands.  There is also strong family history of rheumatoid arthritis.  Her sed rate was elevated and rheumatoid factor was negative.  She had positive ANA.  I will obtain additional labs and x-rays today.  Plan: XR Hand 2 View Right, XR Hand 2 View Left, x-ray of bilateral hands were unremarkable.  Cyclic citrul peptide antibody, IgG, 14-3-3 eta Protein  Pain in both feet -she complains of discomfort in her bilateral feet.  No synovitis was noted.  Plan: XR Foot 2 Views Right, XR Foot 2 Views Left.  X-ray of bilateral feet were unremarkable except for some calcaneal spurs.  [name] without gangrene-she recalls having Raynolds since 2002.  I did not see any decreased capillary refill or digital ulcers on examination.  Although her hands are cool to touch.   Elevated sed rate - 03/04/19: ANA 1:80 cytoplasmic, RF<14, sed rate 45, Vitamin D 16.47, Vitamin B12 373, TSH 3.81  Positive ANA (antinuclear antibody) -she has low titer positive ANA.  I will obtain additional antibodies as she has history of Raynauds.  Plan: Urinalysis, Routine w reflex microscopic, Anti-scleroderma antibody, RNP Antibody, Anti-Smith antibody, Sjogrens syndrome-A extractable nuclear antibody, Sjogrens syndrome-B extractable nuclear antibody, Anti-DNA antibody, double-stranded, C3 and C4, Beta-2 glycoprotein antibodies, Cardiolipin antibodies, IgG, IgM, IgA, Lupus Anticoagulant Eval w/Reflex, Glucose 6 phosphate dehydrogenase  Myalgia -she complains of muscle pain in her extremities.  No muscular weakness was noted.  Plan: CK  Vitamin D deficiency -her vitamin D was low.  She states she took about 3 tablets of vitamin D and then stopped.  Plan: VITAMIN D 25 Hydroxy (Vit-D Deficiency, Fractures)  Family history of rheumatoid arthritis - Maternal grandmother, maternal aunt and first cousin  Orders: Orders Placed This Encounter  Procedures  . XR Hand 2 View Right  . XR Hand 2 View Left  . XR Foot 2 Views Right  . XR Foot 2 Views Left  . Urinalysis, Routine w reflex microscopic  . CK  . VITAMIN D 25 Hydroxy (Vit-D Deficiency, Fractures)  . Cyclic citrul peptide antibody, IgG  . 14-3-3 eta Protein  . Anti-scleroderma antibody  . RNP Antibody  . Anti-Smith antibody  . Sjogrens syndrome-A extractable nuclear antibody  . Sjogrens syndrome-B extractable nuclear antibody  . Anti-DNA antibody, double-stranded  . C3 and C4  . Beta-2 glycoprotein antibodies  . Cardiolipin antibodies, IgG, IgM, IgA  . Lupus Anticoagulant Eval w/Reflex  . Glucose 6 phosphate dehydrogenase   No orders of the defined types were placed in this encounter.   Face-to-face time spent with patient was 45 minutes. Greater than 50% of time was spent in counseling and coordination of care.  Follow-Up  Instructions: No follow-ups on file.   Bo Merino, MD  Note - This record has been created using Editor, commissioning.  Chart creation errors have been sought, but may not always  have been located. Such creation errors do not reflect on  the standard of medical  care.

## 2019-04-23 ENCOUNTER — Ambulatory Visit: Payer: 59 | Admitting: Rheumatology

## 2019-05-01 NOTE — Progress Notes (Deleted)
   Office Visit Note  Patient: Kimberly Horne             Date of Birth: 1971/05/11           MRN: [id number]             PCP: Hoyt Koch, MD Referring: Hoyt Koch, * Visit Date: 05/14/2019 Occupation: @GUAROCC @  Subjective:  No chief complaint on file.   History of Present Illness: Kimberly Horne is a 48 y.o. female ***   Activities of Daily Living:  Patient reports morning stiffness for *** {minute/hour:19697}.   Patient {ACTIONS;DENIES/REPORTS:21021675::"Denies"} nocturnal pain.  Difficulty dressing/grooming: {ACTIONS;DENIES/REPORTS:21021675::"Denies"} Difficulty climbing stairs: {ACTIONS;DENIES/REPORTS:21021675::"Denies"} Difficulty getting out of chair: {ACTIONS;DENIES/REPORTS:21021675::"Denies"} Difficulty using hands for taps, buttons, cutlery, and/or writing: {ACTIONS;DENIES/REPORTS:21021675::"Denies"}  No Rheumatology ROS completed.   PMFS History:  Patient Active Problem List   Diagnosis Date Noted  . LLQ pain 10/24/2017  . Routine general medical examination at a health care facility 01/24/2016    No past medical history on file.  Family History  Problem Relation Age of Onset  . Hyperlipidemia Mother   . Hypertension Mother   . Diabetes Father    Past Surgical History:  Procedure Laterality Date  . CHOLECYSTECTOMY  2002  . COCCYX REMOVAL  1997   Social History   Social History Narrative  . Not on file   Immunization History  Administered Date(s) Administered  . PPD Test 12/11/2017  . Tdap 02/24/2013     Objective: Vital Signs: There were no vitals taken for this visit.   Physical Exam   Musculoskeletal Exam: ***  CDAI Exam: CDAI Score: - Patient Global: -; Provider Global: - Swollen: -; Tender: - Joint Exam   No joint exam has been documented for this visit   There is currently no information documented on the homunculus. Go to the Rheumatology activity and complete the homunculus joint exam.   Investigation: No additional findings.  Imaging: No results found.  Recent Labs: Lab Results  Component Value Date   WBC 7.3 03/04/2019   HGB 12.9 03/04/2019   PLT 286.0 03/04/2019   NA 137 03/04/2019   K 3.7 03/04/2019   CL 103 03/04/2019   CO2 27 03/04/2019   GLUCOSE 101 (H) 03/04/2019   BUN 8 03/04/2019   CREATININE 0.57 03/04/2019   BILITOT 0.4 03/04/2019   ALKPHOS 74 03/04/2019   AST 13 03/04/2019   ALT 13 03/04/2019   PROT 7.5 03/04/2019   ALBUMIN 4.0 03/04/2019   CALCIUM 9.1 03/04/2019   GFRAA >60 04/17/2016    Speciality Comments: No specialty comments available.  Procedures:  No procedures performed Allergies: Patient has no known allergies.   Assessment / Plan:     Visit Diagnoses: No diagnosis found.  Orders: No orders of the defined types were placed in this encounter.  No orders of the defined types were placed in this encounter.   Face-to-face time spent with patient was *** minutes. Greater than 50% of time was spent in counseling and coordination of care.  Follow-Up Instructions: No follow-ups on file.   Bo Merino, MD  Note - This record has been created using Editor, commissioning.  Chart creation errors have been sought, but may not always  have been located. Such creation errors do not reflect on  the standard of medical care.

## 2019-05-06 ENCOUNTER — Encounter: Payer: Self-pay | Admitting: Rheumatology

## 2019-05-06 ENCOUNTER — Ambulatory Visit: Payer: 59 | Admitting: Rheumatology

## 2019-05-06 ENCOUNTER — Ambulatory Visit: Payer: Self-pay

## 2019-05-06 ENCOUNTER — Other Ambulatory Visit: Payer: Self-pay

## 2019-05-06 VITALS — BP 120/79 | HR 65 | Resp 12 | Ht 61.0 in | Wt 133.0 lb

## 2019-05-06 DIAGNOSIS — M79671 Pain in right foot: Secondary | ICD-10-CM

## 2019-05-06 DIAGNOSIS — I7300 Raynaud's syndrome without gangrene: Secondary | ICD-10-CM

## 2019-05-06 DIAGNOSIS — E559 Vitamin D deficiency, unspecified: Secondary | ICD-10-CM

## 2019-05-06 DIAGNOSIS — M79641 Pain in right hand: Secondary | ICD-10-CM | POA: Diagnosis not present

## 2019-05-06 DIAGNOSIS — M79672 Pain in left foot: Secondary | ICD-10-CM | POA: Diagnosis not present

## 2019-05-06 DIAGNOSIS — R7689 Other specified abnormal immunological findings in serum: Secondary | ICD-10-CM

## 2019-05-06 DIAGNOSIS — Z8261 Family history of arthritis: Secondary | ICD-10-CM

## 2019-05-06 DIAGNOSIS — R768 Other specified abnormal immunological findings in serum: Secondary | ICD-10-CM

## 2019-05-06 DIAGNOSIS — M79642 Pain in left hand: Secondary | ICD-10-CM

## 2019-05-06 DIAGNOSIS — M791 Myalgia, unspecified site: Secondary | ICD-10-CM

## 2019-05-06 DIAGNOSIS — R700 Elevated erythrocyte sedimentation rate: Secondary | ICD-10-CM | POA: Diagnosis not present

## 2019-05-09 NOTE — Progress Notes (Signed)
Office Visit Note  Patient: Kimberly Horne             Date of Birth: [birth year]           MRN: [id number]             PCP: Myrlene Broker, MD Referring: Myrlene Broker, * Visit Date: 05/21/2019 Occupation: @GUAROCC @  Subjective:  Raynaud's.   History of Present Illness: Kimberly Horne is a 48 y.o. female with history of osteoarthritis and positive ANA.  She states she continues to have pain and discomfort in her both hands.  She states there are days when she has swelling in her bilateral index fingers.  She is a stiff in the morning.  She also has discomfort in her bilateral wrist joints. She has not noticed any swelling in her wrist.  She continues to have discomfort in her cervical spine.  She also has rainouts.  She states she has to keep her hands warm otherwise they tingle and become numb.  Activities of Daily Living:  Patient reports morning stiffness for 5 minutes.   Patient Reports nocturnal pain.  Difficulty dressing/grooming: Denies Difficulty climbing stairs: Denies Difficulty getting out of chair: Reports Difficulty using hands for taps, buttons, cutlery, and/or writing: Denies  Review of Systems  Constitutional: Negative for fatigue, night sweats, weight gain and weight loss.  HENT: Negative for mouth sores, trouble swallowing, trouble swallowing, mouth dryness and nose dryness.   Eyes: Negative for pain, redness, itching, visual disturbance and dryness.  Respiratory: Negative for cough, shortness of breath, wheezing and difficulty breathing.   Cardiovascular: Negative for chest pain, palpitations, hypertension, irregular heartbeat and swelling in legs/feet.  Gastrointestinal: Negative for abdominal pain, blood in stool, constipation and diarrhea.  Endocrine: Negative for increased urination.  Genitourinary: Negative for difficulty urinating, painful urination and vaginal dryness.  Musculoskeletal: Positive for arthralgias, joint pain,  joint swelling and morning stiffness. Negative for myalgias, muscle weakness, muscle tenderness and myalgias.  Skin: Positive for color change. Negative for rash, hair loss, skin tightness, ulcers and sensitivity to sunlight.  Allergic/Immunologic: Negative for susceptible to infections.  Neurological: Positive for numbness. Negative for dizziness, light-headedness, headaches, memory loss, night sweats and weakness.  Hematological: Negative for bruising/bleeding tendency and swollen glands.  Psychiatric/Behavioral: Negative for depressed mood, confusion and sleep disturbance. The patient is not nervous/anxious.     PMFS History:  Patient Active Problem List   Diagnosis Date Noted  . LLQ pain 10/24/2017  . Routine general medical examination at a health care facility 01/24/2016    History reviewed. No pertinent past medical history.  Family History  Problem Relation Age of Onset  . Hyperlipidemia Mother   . Hypertension Mother   . Diabetes Father   . Healthy Sister   . Healthy Brother   . Healthy Brother   . Healthy Daughter   . Healthy Daughter    Past Surgical History:  Procedure Laterality Date  . CHOLECYSTECTOMY  2002  . COCCYX REMOVAL  1997  . HAND SURGERY Right    cyst removed   Social History   Social History Narrative  . Not on file   Immunization History  Administered Date(s) Administered  . PPD Test 12/11/2017  . Tdap 02/24/2013     Objective: Vital Signs: BP 113/72 (BP Location: Left Arm, Patient Position: Sitting, Cuff Size: Normal)   Pulse 81   Resp 13   Ht 5\' 2"  (1.575 m)   Wt 136 lb 6.4 oz (61.9  kg)   BMI 24.95 kg/m    Physical Exam Vitals signs and nursing note reviewed.  Constitutional:      Appearance: She is well-developed.  HENT:     Head: Normocephalic and atraumatic.  Eyes:     Conjunctiva/sclera: Conjunctivae normal.  Neck:     Musculoskeletal: Normal range of motion.  Cardiovascular:     Rate and Rhythm: Normal rate and regular  rhythm.     Heart sounds: Normal heart sounds.  Pulmonary:     Effort: Pulmonary effort is normal.     Breath sounds: Normal breath sounds.  Abdominal:     General: Bowel sounds are normal.     Palpations: Abdomen is soft.  Lymphadenopathy:     Cervical: No cervical adenopathy.  Skin:    General: Skin is warm and dry.     Capillary Refill: Capillary refill takes less than 2 seconds.  Neurological:     Mental Status: She is alert and oriented to person, place, and time.  Psychiatric:        Behavior: Behavior normal.      Musculoskeletal Exam: C-spine, thoracic and lumbar spine with good range of motion.  Shoulder joints elbow joints wrist joint MCPs PIPs DIPs with good range of motion without synovitis.  She is some prominence of PIP and DIP joints of her hands and her feet.  Hip joints, knee joints, ankles with good range of motion with no synovitis. CDAI Exam: CDAI Score: - Patient Global: -; Provider Global: - Swollen: -; Tender: - Joint Exam   No joint exam has been documented for this visit   There is currently no information documented on the homunculus. Go to the Rheumatology activity and complete the homunculus joint exam.  Investigation: No additional findings.  Imaging: Xr Foot 2 Views Left  Result Date: 05/06/2019 Minimal PIP and DIP narrowing was noted.  No MTP joint narrowing or erosive changes were noted.  No intertarsal tibiotalar joint space narrowing was noted.  Small posterior calcaneal spur was noted. Impression: These findings are consistent with mild osteoarthritis of the foot.  Xr Foot 2 Views Right  Result Date: 05/06/2019 Minimal PIP and DIP narrowing was noted.  No MTP joint narrowing or erosive changes were noted.  No intertarsal tibiotalar joint space narrowing was noted.  Small posterior calcaneal spur was noted. Impression: These findings are consistent with mild osteoarthritis of the foot.  Xr Hand 2 View Left  Result Date: 05/06/2019 No MCP,  PIP or DIP narrowing was noted.  No intercarpal or radiocarpal joint space narrowing was noted.  No erosive changes were noted. Impression: Unremarkable x-ray of the hand.  Xr Hand 2 View Right  Result Date: 05/06/2019 No MCP, PIP or DIP narrowing was noted.  No intercarpal or radiocarpal joint space narrowing was noted.  No erosive changes were noted. Impression: Unremarkable x-ray of the hand.   Recent Labs: Lab Results  Component Value Date   WBC 7.3 03/04/2019   HGB 12.9 03/04/2019   PLT 286.0 03/04/2019   NA 137 03/04/2019   K 3.7 03/04/2019   CL 103 03/04/2019   CO2 27 03/04/2019   GLUCOSE 101 (H) 03/04/2019   BUN 8 03/04/2019   CREATININE 0.57 03/04/2019   BILITOT 0.4 03/04/2019   ALKPHOS 74 03/04/2019   AST 13 03/04/2019   ALT 13 03/04/2019   PROT 7.5 03/04/2019   ALBUMIN 4.0 03/04/2019   CALCIUM 9.1 03/04/2019   GFRAA >60 04/17/2016  May 06, 2019 UA  negative, lupus anticoagulant negative, beta-2 GP 1-, anticardiolipin negative, C3-C4 normal, ENA (SCL 70, RNP, Smith, Ro, La negative), dsDNA 5 (5-9 indeterminate), anti-CCP negative, CK 39, vitamin D 27, G6PD normal  03/04/19: ANA 1:80 cytoplasmic, RF<14, sed rate 45, Vitamin D 16.47, Vitamin B12 373, TSH 3.81  Speciality Comments: No specialty comments available.  Procedures:  No procedures performed Allergies: Patient has no known allergies.   Assessment / Plan:     Visit Diagnoses: Pain in both hands - No synovitis was noted on the examination.  Patient gives history of intermittent swelling.  ANA 1: 80 spasmic, dsDNA 5 indeterminate.  X-rays were unremarkable.  I detailed discussion with the patient.  I have advised her to contact me in case she develops any new symptoms.  Clinical features of autoimmune disease was discussed at length.  I also offered ultrasound examination of the hands in case she develops swelling.  A list of natural anti-inflammatories was given.  Pain in both feet - No synovitis was noted.   X-rays were unremarkable except for calcaneal spurs  [name] without gangrene - History of Raynauds since 2002 per patient.  No decreased capillary refill or digital ulcerations were noted.  Protective clothing and keeping core temperature warm was discussed.  Myalgia - CK is normal.  Vitamin D deficiency - Improved after vitamin D supplement but is still deficient.  Taking long-term vitamin D supplement was discussed.  Family history of rheumatoid arthritis - In patient's maternal grandmother, maternal aunt and her first cousin.  Orders: No orders of the defined types were placed in this encounter.  No orders of the defined types were placed in this encounter.   Face-to-face time spent with patient was 25 minutes. Greater than 50% of time was spent in counseling and coordination of care.  Follow-Up Instructions: Return in about 6 months (around 11/19/2019) for Osteoarthritis, +ANA, dsDNA.   Bo Merino, MD  Note - This record has been created using Editor, commissioning.  Chart creation errors have been sought, but may not always  have been located. Such creation errors do not reflect on  the standard of medical care.

## 2019-05-13 LAB — LUPUS ANTICOAGULANT EVAL W/ REFLEX
PTT-LA Screen: 39 s (ref <=40)
dRVVT: 45 s (ref <=45)

## 2019-05-13 LAB — C3 AND C4
C3 Complement: 156 mg/dL (ref 83–193)
C4 Complement: 32 mg/dL (ref 15–57)

## 2019-05-13 LAB — ANTI-SCLERODERMA ANTIBODY: Scleroderma (Scl-70) (ENA) Antibody, IgG: <1 AI

## 2019-05-13 LAB — URINALYSIS, ROUTINE W REFLEX MICROSCOPIC
Bilirubin Urine: NEGATIVE
Glucose, UA: NEGATIVE
Hgb urine dipstick: NEGATIVE
Ketones, ur: NEGATIVE
Leukocytes,Ua: NEGATIVE
Nitrite: NEGATIVE
Protein, ur: NEGATIVE
Specific Gravity, Urine: 1.019 (ref 1.001–1.03)
pH: 7 (ref 5.0–8.0)

## 2019-05-13 LAB — ANTI-DNA ANTIBODY, DOUBLE-STRANDED: ds DNA Ab: 5 IU/mL — ABNORMAL HIGH

## 2019-05-13 LAB — CARDIOLIPIN ANTIBODIES, IGG, IGM, IGA
Anticardiolipin IgA: <11 [APL'U]
Anticardiolipin IgG: <14 [GPL'U]
Anticardiolipin IgM: <12 [MPL'U]

## 2019-05-13 LAB — VITAMIN D 25 HYDROXY (VIT D DEFICIENCY, FRACTURES): Vit D, 25-Hydroxy: 27 ng/mL — ABNORMAL LOW (ref 30–100)

## 2019-05-13 LAB — ANTI-SMITH ANTIBODY: ENA SM Ab Ser-aCnc: <1 AI

## 2019-05-13 LAB — BETA-2 GLYCOPROTEIN ANTIBODIES
Beta-2 Glyco 1 IgA: <9 SAU (ref <=20)
Beta-2 Glyco 1 IgM: 9 SMU (ref <=20)
Beta-2 Glyco I IgG: <9 SGU (ref <=20)

## 2019-05-13 LAB — RNP ANTIBODY: Ribonucleic Protein(ENA) Antibody, IgG: <1 AI

## 2019-05-13 LAB — SJOGRENS SYNDROME-B EXTRACTABLE NUCLEAR ANTIBODY: SSB (La) (ENA) Antibody, IgG: <1 AI

## 2019-05-13 LAB — CK: Total CK: 39 U/L (ref 29–143)

## 2019-05-13 LAB — 14-3-3 ETA PROTEIN: 14-3-3 eta Protein: <0.2 ng/mL (ref <0.2)

## 2019-05-13 LAB — CYCLIC CITRUL PEPTIDE ANTIBODY, IGG: Cyclic Citrullin Peptide Ab: <16 UNITS

## 2019-05-13 LAB — SJOGRENS SYNDROME-A EXTRACTABLE NUCLEAR ANTIBODY: SSA (Ro) (ENA) Antibody, IgG: <1 AI

## 2019-05-13 LAB — GLUCOSE 6 PHOSPHATE DEHYDROGENASE: G-6PDH: 14.3 U/g Hgb (ref 7.0–20.5)

## 2019-05-13 NOTE — Progress Notes (Signed)
Please advise patient to take vitamin D 2000 units daily.  I will discuss lab results at the follow-up visit.

## 2019-05-14 ENCOUNTER — Ambulatory Visit: Payer: 59 | Admitting: Rheumatology

## 2019-05-15 LAB — HM PAP SMEAR

## 2019-05-18 ENCOUNTER — Encounter: Payer: Self-pay | Admitting: Internal Medicine

## 2019-05-18 NOTE — Progress Notes (Unsigned)
Abstracted and sent to scan  

## 2019-05-21 ENCOUNTER — Other Ambulatory Visit: Payer: Self-pay

## 2019-05-21 ENCOUNTER — Ambulatory Visit: Payer: 59 | Admitting: Rheumatology

## 2019-05-21 ENCOUNTER — Encounter: Payer: Self-pay | Admitting: Rheumatology

## 2019-05-21 VITALS — BP 113/72 | HR 81 | Resp 13 | Ht 62.0 in | Wt 136.4 lb

## 2019-05-21 DIAGNOSIS — M791 Myalgia, unspecified site: Secondary | ICD-10-CM

## 2019-05-21 DIAGNOSIS — M79671 Pain in right foot: Secondary | ICD-10-CM | POA: Diagnosis not present

## 2019-05-21 DIAGNOSIS — M79642 Pain in left hand: Secondary | ICD-10-CM

## 2019-05-21 DIAGNOSIS — M79641 Pain in right hand: Secondary | ICD-10-CM

## 2019-05-21 DIAGNOSIS — Z8261 Family history of arthritis: Secondary | ICD-10-CM

## 2019-05-21 DIAGNOSIS — I7300 Raynaud's syndrome without gangrene: Secondary | ICD-10-CM

## 2019-05-21 DIAGNOSIS — E559 Vitamin D deficiency, unspecified: Secondary | ICD-10-CM

## 2019-05-21 DIAGNOSIS — M79672 Pain in left foot: Secondary | ICD-10-CM

## 2019-07-06 ENCOUNTER — Other Ambulatory Visit: Payer: 59

## 2019-08-18 ENCOUNTER — Other Ambulatory Visit: Payer: Self-pay | Admitting: Obstetrics and Gynecology

## 2019-08-18 ENCOUNTER — Other Ambulatory Visit: Payer: Self-pay

## 2019-08-18 ENCOUNTER — Ambulatory Visit
Admission: RE | Admit: 2019-08-18 | Discharge: 2019-08-18 | Disposition: A | Discharge status: Home / Self Care | Payer: 59 | Source: Ambulatory Visit | Attending: Obstetrics and Gynecology | Admitting: Obstetrics and Gynecology

## 2019-08-18 DIAGNOSIS — N6320 Unspecified lump in the left breast, unspecified quadrant: Secondary | ICD-10-CM

## 2019-08-18 DIAGNOSIS — N6489 Other specified disorders of breast: Secondary | ICD-10-CM

## 2019-09-24 ENCOUNTER — Ambulatory Visit: Payer: 59 | Attending: Family

## 2019-09-24 ENCOUNTER — Other Ambulatory Visit: Payer: Self-pay

## 2019-09-24 DIAGNOSIS — Z23 Encounter for immunization: Secondary | ICD-10-CM

## 2019-09-24 NOTE — Progress Notes (Signed)
   Covid-19 Vaccination Clinic  Name:  Kimberly Horne    MRN: [id number] DOB: Feb 13, 1971  09/24/2019  [name] was observed post Covid-19 immunization for 15 minutes without incidence. She was provided with Vaccine Information Sheet and instruction to access the V-Safe system.   Ms. Cheese was instructed to call 911 with any severe reactions post vaccine: Marland Kitchen Difficulty breathing  . Swelling of your face and throat  . A fast heartbeat  . A bad rash all over your body  . Dizziness and weakness    Immunizations Administered    Name Date Dose VIS Date Route   Moderna COVID-19 Vaccine 09/24/2019 10:15 AM 0.5 mL 07/14/2019 Intramuscular   Manufacturer: Moderna   Lot: [id number]   NDC: 68599-234-14

## 2019-10-27 ENCOUNTER — Ambulatory Visit: Payer: 59 | Attending: Family

## 2019-10-27 DIAGNOSIS — Z23 Encounter for immunization: Secondary | ICD-10-CM

## 2019-10-27 NOTE — Progress Notes (Signed)
   Covid-19 Vaccination Clinic  Name:  Kimberly Horne    MRN: [id number] DOB: [birth year]  10/27/2019  Kimberly Horne was observed post Covid-19 immunization for 15 minutes without incident. She was provided with Vaccine Information Sheet and instruction to access the V-Safe system.   Kimberly Horne was instructed to call 911 with any severe reactions post vaccine: Marland Kitchen Difficulty breathing  . Swelling of face and throat  . A fast heartbeat  . A bad rash all over body  . Dizziness and weakness   Immunizations Administered    Name Date Dose VIS Date Route   Moderna COVID-19 Vaccine 10/27/2019 10:10 AM 0.5 mL 07/14/2019 Intramuscular   Manufacturer: Moderna   Lot: [id number]   NDC: 16837-290-21

## 2019-11-12 NOTE — Progress Notes (Signed)
Office Visit Note  Patient: Kimberly Horne             Date of Birth: 10-01-1970           MRN: [id number]             PCP: Myrlene Broker, MD Referring: Myrlene Broker, * Visit Date: 11/19/2019 Occupation: @GUAROCC @  Subjective:  Neck pain   History of Present Illness: Kimberly Horne is a 49 y.o. female with history of polyarthralgia and Raynaud's.  She presents today with increased neck pain and stiffness.  She has been experiencing trapezius muscle tension and muscle tenderness bilaterally.  She has seen Dr. 52 at [facility] orthopedics in the past.  She states that she went to 1 session of physical therapy but it made her discomfort worse so she discontinued.  She requested a refill for Lidoderm patches which have been helpful in the past.  She is been experiencing muscle spasms intermittently.  She denies any pain or joint swelling in her hands or feet at this time.  She has been taking natural anti-inflammatories as recommended.  She also continues take vitamin D supplement 2000 units daily.  She continues to have occasional symptoms of Raynaud's but states that the symptoms have been very infrequent.  She denies any digital ulcerations.  She has not had any oral or nasal ulcerations, sicca symptoms, rashes, enlarged lymph nodes, or photosensitivity.    Activities of Daily Living:  Patient reports morning stiffness for 15 minutes.   Patient Reports nocturnal pain.  Difficulty dressing/grooming: Denies Difficulty climbing stairs: Denies Difficulty getting out of chair: Denies Difficulty using hands for taps, buttons, cutlery, and/or writing: Denies  Review of Systems  Constitutional: Positive for fatigue.  HENT: Negative for mouth sores, mouth dryness and nose dryness.   Eyes: Negative for pain, visual disturbance and dryness.  Respiratory: Negative for cough, hemoptysis, shortness of breath and difficulty breathing.   Cardiovascular: Positive for  swelling in legs/feet. Negative for chest pain, palpitations and hypertension.  Gastrointestinal: Negative for blood in stool, constipation and diarrhea.  Genitourinary: Negative for painful urination.  Musculoskeletal: Positive for arthralgias, joint pain, muscle weakness, morning stiffness and muscle tenderness. Negative for joint swelling, myalgias and myalgias.  Skin: Negative for color change, pallor, rash, hair loss, nodules/bumps, skin tightness, ulcers and sensitivity to sunlight.  Allergic/Immunologic: Negative for susceptible to infections.  Neurological: Negative for dizziness, numbness, headaches and weakness.  Hematological: Negative for bruising/bleeding tendency and swollen glands.  Psychiatric/Behavioral: Positive for sleep disturbance. Negative for depressed mood. The patient is not nervous/anxious.     PMFS History:  Patient Active Problem List   Diagnosis Date Noted  . LLQ pain 10/24/2017  . Routine general medical examination at a health care facility 01/24/2016    Past Medical History:  Diagnosis Date  . Osteoarthritis     Family History  Problem Relation Age of Onset  . Hyperlipidemia Mother   . Hypertension Mother   . Diabetes Father   . Healthy Sister   . Healthy Brother   . Healthy Brother   . Healthy Daughter   . Healthy Daughter    Past Surgical History:  Procedure Laterality Date  . CHOLECYSTECTOMY  2002  . COCCYX REMOVAL  1997  . HAND SURGERY Right    cyst removed   Social History   Social History Narrative  . Not on file   Immunization History  Administered Date(s) Administered  . Moderna SARS-COVID-2 Vaccination 09/24/2019, 10/27/2019  .  PPD Test 12/11/2017  . Tdap 02/24/2013     Objective: Vital Signs: BP 98/66 (BP Location: Left Arm, Patient Position: Sitting, Cuff Size: Normal)   Pulse 69   Resp 14   Ht 5\' 2"  (1.575 m)   Wt 131 lb (59.4 kg)   LMP 11/19/2019   BMI 23.96 kg/m    Physical Exam Vitals and nursing note  reviewed.  Constitutional:      Appearance: She is well-developed.  HENT:     Head: Normocephalic and atraumatic.  Eyes:     Conjunctiva/sclera: Conjunctivae normal.  Pulmonary:     Effort: Pulmonary effort is normal.  Abdominal:     General: Bowel sounds are normal.     Palpations: Abdomen is soft.  Musculoskeletal:     Cervical back: Normal range of motion.  Lymphadenopathy:     Cervical: No cervical adenopathy.  Skin:    General: Skin is warm and dry.     Capillary Refill: Capillary refill takes less than 2 seconds.  Neurological:     Mental Status: She is alert and oriented to person, place, and time.  Psychiatric:        Behavior: Behavior normal.      Musculoskeletal Exam: C-spine, thoracic spine, and lumbar spine good ROM. Trapezius muscle tension and spasms bilaterally.  No midline spinal tenderness.  No SI joint tenderness.  Shoulder joints, elbow joints, wrist joints, Mcps, PIPs, and DIPs good ROM with no synovitis.  Complete fist formation bilaterally.  Hip joints, knee joints, ankle joints, MTPs, PIPs, and DIPs good ROM with no synovitis.  No warmth or effusion of knee joints.  No tenderness or swelling of ankle joints.   CDAI Exam: CDAI Score: -- Patient Global: --; Provider Global: -- Swollen: --; Tender: -- Joint Exam 11/19/2019   No joint exam has been documented for this visit   There is currently no information documented on the homunculus. Go to the Rheumatology activity and complete the homunculus joint exam.  Investigation: No additional findings.  Imaging: No results found.  Recent Labs: Lab Results  Component Value Date   WBC 7.3 03/04/2019   HGB 12.9 03/04/2019   PLT 286.0 03/04/2019   NA 137 03/04/2019   K 3.7 03/04/2019   CL 103 03/04/2019   CO2 27 03/04/2019   GLUCOSE 101 (H) 03/04/2019   BUN 8 03/04/2019   CREATININE 0.57 03/04/2019   BILITOT 0.4 03/04/2019   ALKPHOS 74 03/04/2019   AST 13 03/04/2019   ALT 13 03/04/2019   PROT  7.5 03/04/2019   ALBUMIN 4.0 03/04/2019   CALCIUM 9.1 03/04/2019   GFRAA >60 04/17/2016    Speciality Comments: No specialty comments available.  Procedures:  No procedures performed Allergies: Patient has no known allergies.   Assessment / Plan:     Visit Diagnoses: Pain in both hands - Patient gives history of intermittent swelling.  ANA 1: 80 cytoplasmic, dsDNA 5 indeterminate.  X-rays were unremarkable: She is not having any joint pain or inflammation in her hands at this time. She does not have any synovitis or tenderness on exam.  She has complete fist formation bilaterally. She was advised to notify us if she develops increased joint pain or joint swelling.  She will follow up in 6 months.   Positive ANA (antinuclear antibody): ANA 1:80 cytoplasmic and dsDNA 5: She experiences infrequent symptoms of Raynaud's but no digital ulcerations, nailbed capillary changes, or delayed capillary refill was noted today.  She has no other clinical  features of autoimmune disease at this time. No recent rashes, photosensitivity, oral or nasal ulcers, sicca symptoms, or enlarged lymph noes. We will obtain [facility] labs.  Orders were provided to the patient.    Pain in both feet - X-rays were unremarkable except for calcaneal spurs.  She is not having any discomfort in her feet at this time.  She wears proper fitting shoes.   [name] without gangrene - History of Raynauds since 2002 per patient.  She has had less frequent symptoms of Raynaud's recently.  No digital ulcerations or signs of gangrene.  She has good capillary refill on exam.  No nailbed capillary changes noted when examined with a dermatoscope today.   Myalgia - CK normal on 05/06/19.  She has not had any increased myalgias or muscle weakness recently. - Plan: lidocaine (LIDODERM) 5 %, VITAMIN D 25 Hydroxy (Vit-D Deficiency, Fractures)  Vitamin D deficiency - She is taking vitamin D 2,000 units daily. We will check vitamin D level  today. Plan: VITAMIN D 25 Hydroxy (Vit-D Deficiency, Fractures)  Family history of rheumatoid arthritis - In patient's maternal grandmother, maternal aunt and her first cousin.  She has no clinical features of rheumatoid arthritis.    Neck pain - She presents today with increased discomfort in her C-spine.  She has good ROM on exam.  No symptoms of radiculopathy.  She has been experiencing trapezius muscle tension and intermittent muscle spasms recently.  She declined x-rays and physical therapy today.  She was given a handout of neck exercises to perform. She is planning on following up with Dr. Regino Schultze at [facility] for further evaluation.  A refill of lidoderm patches were sent to the pharmacy. Plan: lidocaine (LIDODERM) 5 %  Trapezius muscle spasm - She is currently experiencing trapezius muscle tension and spasms.  She declined trigger point injections today.  A refill of lidoderm patches were sent to the pharmacy. Plan: lidocaine (LIDODERM) 5 %  Orders: Orders Placed This Encounter  Procedures  . VITAMIN D 25 Hydroxy (Vit-D Deficiency, Fractures)   Meds ordered this encounter  Medications  . lidocaine (LIDODERM) 5 %    Sig: Place 1 patch onto the skin daily. Remove & Discard patch within 12 hours or as directed by MD    Dispense:  30 patch    Refill:  0    Face-to-face time spent with patient was 30 minutes. Greater than 50% of time was spent in counseling and coordination of care.  Follow-Up Instructions: Return in about 6 months (around 05/20/2020) for Raynaud's syndrome.   Gearldine Bienenstock, PA-C   I examined and evaluated the patient with Kimberly Ales PA.  Patient continues to have some pain and discomfort in the trapezius area.  Have given her prescription for Lidoderm patches.  She has fairly good range of motion in her cervical spine.  No synovitis was noted on the examination.  The plan of care was discussed as noted above.  Pollyann Savoy, MD  Note - This record has  been created using Animal nutritionist.  Chart creation errors have been sought, but may not always  have been located. Such creation errors do not reflect on  the standard of medical care.

## 2019-11-19 ENCOUNTER — Telehealth: Payer: Self-pay | Admitting: *Deleted

## 2019-11-19 ENCOUNTER — Encounter: Payer: Self-pay | Admitting: Rheumatology

## 2019-11-19 ENCOUNTER — Ambulatory Visit: Payer: 59 | Admitting: Rheumatology

## 2019-11-19 ENCOUNTER — Other Ambulatory Visit: Payer: Self-pay

## 2019-11-19 VITALS — BP 98/66 | HR 69 | Resp 14 | Ht 62.0 in | Wt 131.0 lb

## 2019-11-19 DIAGNOSIS — M791 Myalgia, unspecified site: Secondary | ICD-10-CM

## 2019-11-19 DIAGNOSIS — M79642 Pain in left hand: Secondary | ICD-10-CM

## 2019-11-19 DIAGNOSIS — M79641 Pain in right hand: Secondary | ICD-10-CM

## 2019-11-19 DIAGNOSIS — M62838 Other muscle spasm: Secondary | ICD-10-CM

## 2019-11-19 DIAGNOSIS — R768 Other specified abnormal immunological findings in serum: Secondary | ICD-10-CM | POA: Diagnosis not present

## 2019-11-19 DIAGNOSIS — Z8261 Family history of arthritis: Secondary | ICD-10-CM

## 2019-11-19 DIAGNOSIS — M79671 Pain in right foot: Secondary | ICD-10-CM

## 2019-11-19 DIAGNOSIS — M542 Cervicalgia: Secondary | ICD-10-CM

## 2019-11-19 DIAGNOSIS — E559 Vitamin D deficiency, unspecified: Secondary | ICD-10-CM

## 2019-11-19 DIAGNOSIS — I7300 Raynaud's syndrome without gangrene: Secondary | ICD-10-CM | POA: Diagnosis not present

## 2019-11-19 DIAGNOSIS — M79672 Pain in left foot: Secondary | ICD-10-CM

## 2019-11-19 LAB — VITAMIN D 25 HYDROXY (VIT D DEFICIENCY, FRACTURES): Vit D, 25-Hydroxy: 25 ng/mL — ABNORMAL LOW (ref 30–100)

## 2019-11-19 MED ORDER — LIDOCAINE 5 % EX PTCH: 1.0000 | MEDICATED_PATCH | CUTANEOUS | 0 refills | Status: DC

## 2019-11-19 NOTE — Telephone Encounter (Signed)
Submitted a Prior Authorization request to [facility] for Lidociane patches  via Cover My Meds. Will update once we receive a response.

## 2019-11-19 NOTE — Patient Instructions (Signed)

## 2019-11-20 ENCOUNTER — Telehealth: Payer: Self-pay | Admitting: *Deleted

## 2019-11-20 DIAGNOSIS — E559 Vitamin D deficiency, unspecified: Secondary | ICD-10-CM

## 2019-11-20 MED ORDER — VITAMIN D (ERGOCALCIFEROL) 1.25 MG (50000 UNIT) PO CAPS: 50000.0000 [IU] | ORAL_CAPSULE | ORAL | 0 refills | Status: DC

## 2019-11-20 NOTE — Progress Notes (Signed)
Vitamin D is low-25. Please notify patient and send in vitamin D 50,000 units by mouth once weekly for 3 months.  We will recheck vitamin D in 3 months.

## 2019-11-20 NOTE — Telephone Encounter (Signed)
-----   Message from Gearldine Bienenstock, PA-C sent at 11/20/2019  8:23 AM EDT ----- Vitamin D is low-25. Please notify patient and send in vitamin D 50,000 units by mouth once weekly for 3 months.  We will recheck vitamin D in 3 months.

## 2019-11-24 NOTE — Telephone Encounter (Signed)
Received a fax regarding Prior Authorization from [facility] for Lidocaine Patches . Authorization has been DENIED.   Patient advised

## 2019-12-01 ENCOUNTER — Telehealth: Payer: Self-pay

## 2019-12-01 NOTE — Telephone Encounter (Signed)
[facility] labs received via fax.  Collected: 11/23/2019  Positive ANA 1:160 H  Labs reviewed by Sherron Ales, PA-C and Patient has history of positive ANA 1:80 cytoplasmic, dsDNA 5.   Advised patient of lab results and if she develops any new signs or symptoms to call the office. Patient verbalized understanding. Will send [facility] labs to scan center.

## 2019-12-28 ENCOUNTER — Other Ambulatory Visit: Payer: 59

## 2020-01-21 ENCOUNTER — Ambulatory Visit
Admission: RE | Admit: 2020-01-21 | Discharge: 2020-01-21 | Disposition: A | Discharge status: Home / Self Care | Payer: 59 | Source: Ambulatory Visit | Attending: Obstetrics and Gynecology | Admitting: Obstetrics and Gynecology

## 2020-01-21 ENCOUNTER — Other Ambulatory Visit: Payer: Self-pay

## 2020-01-21 DIAGNOSIS — N6320 Unspecified lump in the left breast, unspecified quadrant: Secondary | ICD-10-CM

## 2020-05-09 ENCOUNTER — Telehealth (INDEPENDENT_AMBULATORY_CARE_PROVIDER_SITE_OTHER): Payer: 59 | Admitting: Family

## 2020-05-09 ENCOUNTER — Telehealth: Payer: Self-pay | Admitting: Internal Medicine

## 2020-05-09 ENCOUNTER — Other Ambulatory Visit: Payer: Self-pay | Admitting: Family

## 2020-05-09 DIAGNOSIS — J019 Acute sinusitis, unspecified: Secondary | ICD-10-CM | POA: Diagnosis not present

## 2020-05-09 DIAGNOSIS — J069 Acute upper respiratory infection, unspecified: Secondary | ICD-10-CM

## 2020-05-09 MED ORDER — AMOXICILLIN-POT CLAVULANATE 875-125 MG PO TABS: 1.0000 | ORAL_TABLET | Freq: Two times a day (BID) | ORAL | 0 refills | Status: AC

## 2020-05-09 MED ORDER — FLUTICASONE PROPIONATE 50 MCG/ACT NA SUSP: 1.0000 | Freq: Every day | NASAL | 1 refills | Status: DC

## 2020-05-09 NOTE — Progress Notes (Signed)
Kimberly Horne is a 49 y.o. female with the following history as recorded in EpicCare:  Patient Active Problem List   Diagnosis Date Noted  . LLQ pain 10/24/2017  . Routine general medical examination at a health care facility 01/24/2016    Current Outpatient Medications  Medication Sig Dispense Refill  . Cholecalciferol (VITAMIN D) 50 MCG (2000 UT) CAPS Take by mouth daily.    . Ginger, Zingiber officinalis, (GINGER ROOT PO) Take by mouth.    . lidocaine (LIDODERM) 5 % Place 1 patch onto the skin daily. Remove & Discard patch within 12 hours or as directed by MD 30 patch 0  . Multiple Vitamin (MULTIVITAMIN PO) Take by mouth daily.    . Omega-3 Fatty Acids (FISH OIL PO) Take by mouth.    . TURMERIC PO Take by mouth.    . fluticasone (FLONASE) 50 MCG/ACT nasal spray Place 1 spray into both nostrils daily. (Patient not taking: Reported on 03/04/2019) 16 g 1  . Vitamin D, Ergocalciferol, (DRISDOL) 1.25 MG (50000 UNIT) CAPS capsule Take 1 capsule (50,000 Units total) by mouth every 7 (seven) days. (Patient not taking: Reported on 05/09/2020) 12 capsule 0   No current facility-administered medications for this visit.    Allergies: Patient has no known allergies.  Past Medical History:  Diagnosis Date  . Osteoarthritis     Past Surgical History:  Procedure Laterality Date  . CHOLECYSTECTOMY  2002  . COCCYX REMOVAL  1997  . HAND SURGERY Right    cyst removed    Family History  Problem Relation Age of Onset  . Hyperlipidemia Mother   . Hypertension Mother   . Diabetes Father   . Healthy Sister   . Healthy Brother   . Healthy Brother   . Healthy Daughter   . Healthy Daughter     Social History   Tobacco Use  . Smoking status: Never Smoker  . Smokeless tobacco: Never Used  Substance Use Topics  . Alcohol use: Not Currently    Subjective:   I connected with Dessa Phi on 05/09/20 at 10:20 AM EDT by a video enabled telemedicine application and verified that I  am speaking with the correct person using two identifiers.   I discussed the limitations of evaluation and management by telemedicine and the availability of in person appointments. The patient expressed understanding and agreed to proceed. Provider in office/ patient is at home; provider and patient are only 2 people on video call.   Complaining of sinus pain/ pressure and right ear pain x 1 week; using OTC allergy medication; feels drainage in the back of her throat and concerned about moving into chest; has taken negative COVID test in the past week;     Objective:  There were no vitals filed for this visit.  General: Well developed, well nourished, in no acute distress  Head: Normocephalic and atraumatic  Lungs: Respirations unlabored;  Neurologic: Alert and oriented; speech intact; face symmetrical; moves all extremities well; CNII-XII intact without focal deficit   Assessment:  1. Acute sinusitis, recurrence not specified, unspecified location     Plan:  Rx for Augmentin 875 mg bid x 10 days, Flonase NS; increase fluids, rest and follow up worse, no better; okay for in office visit to evaluate ears if symptoms persist.   No follow-ups on file.  No orders of the defined types were placed in this encounter.   Requested Prescriptions    No prescriptions requested or ordered in this encounter

## 2020-05-09 NOTE — Telephone Encounter (Signed)
Patient called and was asking about the medications that was talked about during her visit. She said that she went to the pharmacy and they had no been called in yet.    Please call pt back:[phone]   She was also wondering if the prescriptions can be sent to [facility] on [address] [place], [place] [address]

## 2020-05-09 NOTE — Telephone Encounter (Signed)
Tried to reach patient but voicemail not set up to leave message.

## 2020-05-09 NOTE — Telephone Encounter (Signed)
Re-sent prescriptions to [facility] on [address] as requested;

## 2020-05-20 NOTE — Progress Notes (Signed)
Office Visit Note  Patient: Kimberly Horne             Date of Birth: [birth year]           MRN: [id number]             PCP: Myrlene Broker, MD Referring: Myrlene Broker, * Visit Date: 06/02/2020 Occupation: @GUAROCC @  Subjective:  Other (left shoulder pain, leg cramps )   History of Present Illness: Kimberly Horne is a 49 y.o. female with positive ANA and myalgias.  She states recently she has been having increased pain and stiffness in her bilateral trapezius area.  She states she is unable to sleep on her pillow on the side.  When she sleeps on her back her thoracic area starts hurting.  She states even when she snaps her bra that area become sensitive.  She has been having pain and discomfort in her left great toe when it touches the mattress.  She states she has a firm mattress.  She has been experiencing nocturnal pain which causes insomnia.  Has been also experiencing increased pain in her knee joints which she describes in the popliteal region.  Activities of Daily Living:  Patient reports morning stiffness for 30 minutes.   Patient Reports nocturnal pain.  Difficulty dressing/grooming: Reports Difficulty climbing stairs: Denies Difficulty getting out of chair: Denies Difficulty using hands for taps, buttons, cutlery, and/or writing: Denies  Review of Systems  Constitutional: Negative for fatigue.  HENT: Negative for mouth sores, mouth dryness and nose dryness.   Eyes: Negative for pain, itching and dryness.  Respiratory: Negative for shortness of breath and difficulty breathing.   Cardiovascular: Negative for chest pain and palpitations.  Gastrointestinal: Negative for blood in stool, constipation and diarrhea.  Endocrine: Negative for increased urination.  Genitourinary: Negative for difficulty urinating.  Musculoskeletal: Positive for arthralgias, joint pain, joint swelling, myalgias, morning stiffness, muscle tenderness and myalgias.  Skin:  Negative for color change, rash and redness.  Allergic/Immunologic: Negative for susceptible to infections.  Neurological: Positive for numbness. Negative for dizziness, headaches, memory loss and weakness.  Hematological: Positive for bruising/bleeding tendency.  Psychiatric/Behavioral: Positive for sleep disturbance. Negative for confusion.    PMFS History:  Patient Active Problem List   Diagnosis Date Noted  . Vitamin D deficiency 06/02/2020  . LLQ pain 10/24/2017  . Routine general medical examination at a health care facility 01/24/2016    Past Medical History:  Diagnosis Date  . Osteoarthritis     Family History  Problem Relation Age of Onset  . Hyperlipidemia Mother   . Hypertension Mother   . Diabetes Father   . Healthy Sister   . Healthy Brother   . Healthy Brother   . Healthy Daughter   . Healthy Daughter    Past Surgical History:  Procedure Laterality Date  . CHOLECYSTECTOMY  2002  . COCCYX REMOVAL  1997  . HAND SURGERY Right    cyst removed   Social History   Social History Narrative  . Not on file   Immunization History  Administered Date(s) Administered  . Moderna SARS-COVID-2 Vaccination 09/24/2019, 10/27/2019  . PPD Test 12/11/2017  . Tdap 02/24/2013     Objective: Vital Signs: BP 127/84 (BP Location: Right Arm, Patient Position: Sitting, Cuff Size: Normal)   Pulse 73   Resp 14   Ht 5\' 1"  (1.549 m)   Wt 135 lb 9.6 oz (61.5 kg)   BMI 25.62 kg/m    Physical Exam  Vitals and nursing note reviewed.  Constitutional:      Appearance: She is well-developed.  HENT:     Head: Normocephalic and atraumatic.  Eyes:     Conjunctiva/sclera: Conjunctivae normal.  Cardiovascular:     Rate and Rhythm: Normal rate and regular rhythm.     Heart sounds: Normal heart sounds.  Pulmonary:     Effort: Pulmonary effort is normal.     Breath sounds: Normal breath sounds.  Abdominal:     General: Bowel sounds are normal.     Palpations: Abdomen is soft.   Musculoskeletal:     Cervical back: Normal range of motion.  Lymphadenopathy:     Cervical: No cervical adenopathy.  Skin:    General: Skin is warm and dry.     Capillary Refill: Capillary refill takes less than 2 seconds.  Neurological:     Mental Status: She is alert and oriented to person, place, and time.  Psychiatric:        Behavior: Behavior normal.      Musculoskeletal Exam: She had good range of motion of her cervical spine with some discomfort. She has some tenderness over mid thoracic region. Lumbar spine was in good range of motion. She had no SI joint tenderness. Shoulder joints, elbow joints, wrist joints, MCPs PIPs and DIPs with good range of motion with no synovitis. Hip joints, knee joints, ankles, MTPs with good range of motion with no synovitis. She has bilateral first MTP thickening consistent with osteoarthritis.  CDAI Exam: CDAI Score: -- Patient Global: --; Provider Global: -- Swollen: --; Tender: -- Joint Exam 06/02/2020   No joint exam has been documented for this visit   There is currently no information documented on the homunculus. Go to the Rheumatology activity and complete the homunculus joint exam.  Investigation: No additional findings.  Imaging: No results found.  Recent Labs: Lab Results  Component Value Date   WBC 7.3 03/04/2019   HGB 12.9 03/04/2019   PLT 286.0 03/04/2019   NA 137 03/04/2019   K 3.7 03/04/2019   CL 103 03/04/2019   CO2 27 03/04/2019   GLUCOSE 101 (H) 03/04/2019   BUN 8 03/04/2019   CREATININE 0.57 03/04/2019   BILITOT 0.4 03/04/2019   ALKPHOS 74 03/04/2019   AST 13 03/04/2019   ALT 13 03/04/2019   PROT 7.5 03/04/2019   ALBUMIN 4.0 03/04/2019   CALCIUM 9.1 03/04/2019   GFRAA >60 04/17/2016    Speciality Comments: No specialty comments available.  Procedures:  No procedures performed Allergies: Patient has no known allergies.   Assessment / Plan:     Visit Diagnoses: Positive ANA (antinuclear antibody)  - ANA 1:80 cytoplasmic and dsDNA 5: She experiences infrequent symptoms of Raynaud's  -she denies any history of oral ulcers, nasal ulcers, malar rash or photosensitivity.  Raynaud's is not very prominent currently.  She has been experiencing increased muscle pain.  I will obtain autoimmune labs today.  Plan: CBC with Differential/Platelet, COMPLETE METABOLIC PANEL WITH GFR, Urinalysis, Routine w reflex microscopic, ANA, Anti-DNA antibody, double-stranded, C3 and C4  [name] without gangrene-currently not active.  Neck pain -she had discomfort range of motion of her cervical spine.  Have wanted to obtain cervical spine x-rays but she later determined that she skipped her.  73-month and we canceled the C-spine x-ray.  Plan: lidocaine (LIDODERM) 5 %, CANCELED: XR Cervical Spine 2 or 3 views  Pain in thoracic spine -she complains of midthoracic pain.  Plan x-rays at the  follow-up visit as patient descriptor.  41-month.  Trapezius muscle spasm -he had bilateral trapezius spasm.  Prescription for Lidoderm patches was given.  Plan: lidocaine (LIDODERM) 5 %  Chronic pain of both knees -she complains of pain and discomfort in her bilateral knee joints.  I will obtain labs today.  We will obtain x-rays at the follow-up visit.  Plan: Sedimentation rate, Rheumatoid factor, Cyclic citrul peptide antibody, IgG, 14-3-3 eta Protein,  Other insomnia-she complains of insomnia due to nocturnal pain.  She states she has firm mattress.  Have advised her to get pillow top for her mattress or change her mattress preferably to Tempur-Pedic.  Fibromyalgia -she complains of increased muscle pain.  She has generalized hyperalgesia and positive tender points.  CK is normal.  I will refer her to physical therapy.- Plan:Ambulatory referral to Physical Therapy  Vitamin D deficiency -she has longstanding history of vitamin D deficiency.  Plan: VITAMIN D 25 Hydroxy (Vit-D Deficiency, Fractures)  Family history of  rheumatoid arthritis  Orders: Orders Placed This Encounter  Procedures  . CBC with Differential/Platelet  . COMPLETE METABOLIC PANEL WITH GFR  . Urinalysis, Routine w reflex microscopic  . ANA  . Anti-DNA antibody, double-stranded  . C3 and C4  . Sedimentation rate  . VITAMIN D 25 Hydroxy (Vit-D Deficiency, Fractures)  . Rheumatoid factor  . Cyclic citrul peptide antibody, IgG  . 14-3-3 eta Protein  . Ambulatory referral to Physical Therapy   Meds ordered this encounter  Medications  . lidocaine (LIDODERM) 5 %    Sig: Place 1 patch onto the skin daily. Remove & Discard patch within 12 hours or as directed by MD    Dispense:  30 patch    Refill:  0      Follow-Up Instructions: Return in about 4 weeks (around 06/30/2020) for Polyarthralgia, positive ANA.   Pollyann Savoy, MD  Note - This record has been created using Animal nutritionist.  Chart creation errors have been sought, but may not always  have been located. Such creation errors do not reflect on  the standard of medical care.

## 2020-05-24 ENCOUNTER — Ambulatory Visit: Payer: 59 | Admitting: Rheumatology

## 2020-06-02 ENCOUNTER — Ambulatory Visit: Payer: 59 | Admitting: Rheumatology

## 2020-06-02 ENCOUNTER — Other Ambulatory Visit: Payer: Self-pay

## 2020-06-02 ENCOUNTER — Encounter: Payer: Self-pay | Admitting: Rheumatology

## 2020-06-02 ENCOUNTER — Ambulatory Visit: Payer: Self-pay

## 2020-06-02 VITALS — BP 127/84 | HR 73 | Resp 14 | Ht 61.0 in | Wt 135.6 lb

## 2020-06-02 DIAGNOSIS — M542 Cervicalgia: Secondary | ICD-10-CM | POA: Diagnosis not present

## 2020-06-02 DIAGNOSIS — I7300 Raynaud's syndrome without gangrene: Secondary | ICD-10-CM | POA: Diagnosis not present

## 2020-06-02 DIAGNOSIS — M546 Pain in thoracic spine: Secondary | ICD-10-CM

## 2020-06-02 DIAGNOSIS — M62838 Other muscle spasm: Secondary | ICD-10-CM

## 2020-06-02 DIAGNOSIS — E559 Vitamin D deficiency, unspecified: Secondary | ICD-10-CM

## 2020-06-02 DIAGNOSIS — M25561 Pain in right knee: Secondary | ICD-10-CM

## 2020-06-02 DIAGNOSIS — R7689 Other specified abnormal immunological findings in serum: Secondary | ICD-10-CM

## 2020-06-02 DIAGNOSIS — R768 Other specified abnormal immunological findings in serum: Secondary | ICD-10-CM

## 2020-06-02 DIAGNOSIS — M797 Fibromyalgia: Secondary | ICD-10-CM

## 2020-06-02 DIAGNOSIS — G8929 Other chronic pain: Secondary | ICD-10-CM

## 2020-06-02 DIAGNOSIS — M25562 Pain in left knee: Secondary | ICD-10-CM

## 2020-06-02 DIAGNOSIS — Z8261 Family history of arthritis: Secondary | ICD-10-CM

## 2020-06-02 DIAGNOSIS — G4709 Other insomnia: Secondary | ICD-10-CM

## 2020-06-02 MED ORDER — LIDOCAINE 5 % EX PTCH: 1.0000 | MEDICATED_PATCH | CUTANEOUS | 0 refills | Status: DC

## 2020-06-03 NOTE — Progress Notes (Signed)
CBC, CMP are normal.  Double-stranded DNA is low titer and stable.  Complements normal.  Sed rate is mildly elevated but better than previous value.  Vitamin D is normal.  Rheumatoid factor and anti-CCP are negative.  Patient was asymptomatic and no treatment is needed.  She should contact us if she develops any new symptoms.

## 2020-06-06 ENCOUNTER — Telehealth: Payer: Self-pay | Admitting: *Deleted

## 2020-06-06 NOTE — Telephone Encounter (Signed)
error 

## 2020-06-07 ENCOUNTER — Telehealth: Payer: Self-pay | Admitting: *Deleted

## 2020-06-07 NOTE — Telephone Encounter (Signed)
Submitted a Prior Authorization request to [facility] for Lidoderm Patches via Cover My Meds. Will update once we receive a response.

## 2020-06-07 NOTE — Telephone Encounter (Signed)
Attempted to contact patient to advised PA is in process, awaiting response from insurance. Unable to leave a message for patient, voicemail not set up.

## 2020-06-07 NOTE — Telephone Encounter (Signed)
Patient requesting update on PA for lidocaine patches.

## 2020-06-13 NOTE — Telephone Encounter (Signed)
Received a fax regarding Prior Authorization from [facility] for Lidocaine Patches. Authorization has been DENIED because patient has to have one of the following diagnoses: (1.) (A) post-herpetic neuralgia or (B) Neuropathic Pain. (2.) Patient has failed or can not use lidocaine transdermal patch.

## 2020-06-14 LAB — URINALYSIS, ROUTINE W REFLEX MICROSCOPIC
Bacteria, UA: NONE SEEN /HPF
Bilirubin Urine: NEGATIVE
Glucose, UA: NEGATIVE
Hyaline Cast: NONE SEEN /LPF
Ketones, ur: NEGATIVE
Nitrite: NEGATIVE
Protein, ur: NEGATIVE
RBC / HPF: NONE SEEN /HPF (ref 0–2)
Specific Gravity, Urine: 1.01 (ref 1.001–1.03)
Squamous Epithelial / HPF: NONE SEEN /HPF (ref <=5)
pH: 7 (ref 5.0–8.0)

## 2020-06-14 LAB — COMPLETE METABOLIC PANEL WITH GFR
AG Ratio: 1.3 (calc) (ref 1.0–2.5)
ALT: 16 U/L (ref 6–29)
AST: 15 U/L (ref 10–35)
Albumin: 4.4 g/dL (ref 3.6–5.1)
Alkaline phosphatase (APISO): 78 U/L (ref 31–125)
BUN: 10 mg/dL (ref 7–25)
CO2: 27 mmol/L (ref 20–32)
Calcium: 9.2 mg/dL (ref 8.6–10.2)
Chloride: 100 mmol/L (ref 98–110)
Creat: 0.56 mg/dL (ref 0.50–1.10)
GFR, Est African American: 127 mL/min/{1.73_m2} (ref >=60)
GFR, Est Non African American: 109 mL/min/{1.73_m2} (ref >=60)
Globulin: 3.5 g/dL (calc) (ref 1.9–3.7)
Glucose, Bld: 77 mg/dL (ref 65–99)
Potassium: 4.1 mmol/L (ref 3.5–5.3)
Sodium: 135 mmol/L (ref 135–146)
Total Bilirubin: 0.6 mg/dL (ref 0.2–1.2)
Total Protein: 7.9 g/dL (ref 6.1–8.1)

## 2020-06-14 LAB — 14-3-3 ETA PROTEIN: 14-3-3 eta Protein: <0.2 ng/mL (ref <0.2)

## 2020-06-14 LAB — CBC WITH DIFFERENTIAL/PLATELET
Absolute Monocytes: 483 cells/uL (ref 200–950)
Basophils Absolute: 43 cells/uL (ref 0–200)
Basophils Relative: 0.6 %
Eosinophils Absolute: 163 cells/uL (ref 15–500)
Eosinophils Relative: 2.3 %
HCT: 41.2 % (ref 35.0–45.0)
Hemoglobin: 13.5 g/dL (ref 11.7–15.5)
Lymphs Abs: 2116 cells/uL (ref 850–3900)
MCH: 27.8 pg (ref 27.0–33.0)
MCHC: 32.8 g/dL (ref 32.0–36.0)
MCV: 84.9 fL (ref 80.0–100.0)
MPV: 9.9 fL (ref 7.5–12.5)
Monocytes Relative: 6.8 %
Neutro Abs: 4296 cells/uL (ref 1500–7800)
Neutrophils Relative %: 60.5 %
Platelets: 299 10*3/uL (ref 140–400)
RBC: 4.85 10*6/uL (ref 3.80–5.10)
RDW: 13.7 % (ref 11.0–15.0)
Total Lymphocyte: 29.8 %
WBC: 7.1 10*3/uL (ref 3.8–10.8)

## 2020-06-14 LAB — C3 AND C4
C3 Complement: 161 mg/dL (ref 83–193)
C4 Complement: 33 mg/dL (ref 15–57)

## 2020-06-14 LAB — VITAMIN D 25 HYDROXY (VIT D DEFICIENCY, FRACTURES): Vit D, 25-Hydroxy: 35 ng/mL (ref 30–100)

## 2020-06-14 LAB — ANTI-DNA ANTIBODY, DOUBLE-STRANDED: ds DNA Ab: 6 IU/mL — ABNORMAL HIGH

## 2020-06-14 LAB — ANA: Anti Nuclear Antibody (ANA): POSITIVE — AB

## 2020-06-14 LAB — CYCLIC CITRUL PEPTIDE ANTIBODY, IGG: Cyclic Citrullin Peptide Ab: <16 UNITS

## 2020-06-14 LAB — ANTI-NUCLEAR AB-TITER (ANA TITER): ANA Titer 1: 1:160 {titer} — ABNORMAL HIGH

## 2020-06-14 LAB — RHEUMATOID FACTOR: Rheumatoid fact SerPl-aCnc: <14 IU/mL (ref <14)

## 2020-06-14 LAB — SEDIMENTATION RATE: Sed Rate: 38 mm/h — ABNORMAL HIGH (ref 0–20)

## 2020-06-14 NOTE — Telephone Encounter (Signed)
Patient advised Lidoderm PA has been denied. Patient asked if she can purchase them even though the PA was denied. Patient advised she may pay cash for the prescription. Patient advised she can use a goodrx coupon to make them more cost effective. Patient expressed understanding.

## 2020-06-14 NOTE — Telephone Encounter (Signed)
Please notify the patient.

## 2020-06-15 NOTE — Progress Notes (Signed)
I will discuss results at the follow-up visit.

## 2020-06-17 ENCOUNTER — Ambulatory Visit: Payer: 59 | Attending: Family

## 2020-06-17 DIAGNOSIS — Z23 Encounter for immunization: Secondary | ICD-10-CM

## 2020-06-17 NOTE — Progress Notes (Addendum)
Office Visit Note  Patient: Kimberly Horne             Date of Birth: [birth year]           MRN: [id number]             PCP: Hoyt Koch, MD Referring: Hoyt Koch, * Visit Date: 07/01/2020 Occupation: @GUAROCC @  Subjective:  Pain in multiple joints and muscles.   History of Present Illness: Petrona Wyeth is a 49 y.o. female with history of positive ANA, Raynaud's and arthralgias.  She states she has been having nocturnal pain.  She describes pain in her neck and her upper back.  She also has discomfort in her bilateral knee joints.  She states the neck pain radiates into her left arm.  She has nocturnal pain.  She is not comfortable laying in the bed due to neck pain.  She also has discomfort when people hug her.  She complains of hyperalgesia.  There is no history of oral ulcers, nasal ulcers, malar rash, photosensitivity, sicca symptoms, joint swelling.  She complains of [name].  Activities of Daily Living:  Patient reports morning stiffness for a few minutes.   Patient Reports nocturnal pain.  Difficulty dressing/grooming: Denies Difficulty climbing stairs: Denies Difficulty getting out of chair: Denies Difficulty using hands for taps, buttons, cutlery, and/or writing: Denies  Review of Systems  Constitutional: Negative for fatigue.  HENT: Negative for mouth sores, mouth dryness and nose dryness.   Eyes: Negative for pain, itching and dryness.  Respiratory: Negative for shortness of breath and difficulty breathing.   Cardiovascular: Negative for chest pain and palpitations.  Gastrointestinal: Negative for blood in stool, constipation and diarrhea.  Endocrine: Negative for increased urination.  Genitourinary: Negative for difficulty urinating.  Musculoskeletal: Positive for arthralgias, joint pain, myalgias, morning stiffness, muscle tenderness and myalgias. Negative for joint swelling.  Skin: Positive for color change. Negative  for rash, hair loss, redness and sensitivity to sunlight.  Allergic/Immunologic: Negative for susceptible to infections.  Neurological: Positive for numbness. Negative for dizziness, headaches, memory loss and weakness.  Hematological: Positive for bruising/bleeding tendency. Negative for swollen glands.  Psychiatric/Behavioral: Positive for sleep disturbance. The patient is nervous/anxious.     PMFS History:  Patient Active Problem List   Diagnosis Date Noted  . Vitamin D deficiency 06/02/2020  . LLQ pain 10/24/2017  . Routine general medical examination at a health care facility 01/24/2016    Past Medical History:  Diagnosis Date  . Osteoarthritis     Family History  Problem Relation Age of Onset  . Hyperlipidemia Mother   . Hypertension Mother   . Diabetes Father   . Healthy Sister   . Healthy Brother   . Healthy Brother   . Healthy Daughter   . Healthy Daughter    Past Surgical History:  Procedure Laterality Date  . CHOLECYSTECTOMY  2002  . COCCYX REMOVAL  1997  . HAND SURGERY Right    cyst removed   Social History   Social History Narrative  . Not on file   Immunization History  Administered Date(s) Administered  . Moderna SARS-COVID-2 Vaccination 09/24/2019, 10/27/2019, 06/17/2020  . PPD Test 12/11/2017  . Tdap 02/24/2013     Objective: Vital Signs: BP 132/83 (BP Location: Left Arm, Patient Position: Sitting, Cuff Size: Normal)   Pulse 73   Resp 14   Ht 5' 2"  (1.575 m)   Wt 136 lb 3.2 oz (61.8 kg)   BMI 24.91 kg/m  Physical Exam Vitals and nursing note reviewed.  Constitutional:      Appearance: She is well-developed.  HENT:     Head: Normocephalic and atraumatic.  Eyes:     Conjunctiva/sclera: Conjunctivae normal.  Cardiovascular:     Rate and Rhythm: Normal rate and regular rhythm.     Heart sounds: Normal heart sounds.  Pulmonary:     Effort: Pulmonary effort is normal.     Breath sounds: Normal breath sounds.  Abdominal:     General:  Bowel sounds are normal.     Palpations: Abdomen is soft.  Musculoskeletal:     Cervical back: Normal range of motion.  Lymphadenopathy:     Cervical: No cervical adenopathy.  Skin:    General: Skin is warm and dry.     Capillary Refill: Capillary refill takes less than 2 seconds.     Comments: No nailbed capillary changes were noted.  No sclerodactyly was noted.  She had good capillary refill.  Neurological:     Mental Status: She is alert and oriented to person, place, and time.  Psychiatric:        Behavior: Behavior normal.      Musculoskeletal Exam: She has good range of motion of her cervical spine with some discomfort in the trapezius region.  She has some discomfort in the thoracic region.  Lumbar spine was in good range of motion.  Shoulder joints, elbow joints, wrist joints, MCPs PIPs and DIPs with good range of motion with no synovitis.  Hip joints, knee joints, ankles, MTPs and PIPs with good range of motion with no synovitis.  CDAI Exam: CDAI Score: -- Patient Global: --; Provider Global: -- Swollen: --; Tender: -- Joint Exam 07/01/2020   No joint exam has been documented for this visit   There is currently no information documented on the homunculus. Go to the Rheumatology activity and complete the homunculus joint exam.  Investigation: No additional findings.  Imaging: XR Thoracic Spine 2 View  Result Date: 07/01/2020 Significant disc space narrowing was noted.  Anterior spurring was noted. Impression: These findings are consistent with multilevel spondylosis.  XR Cervical Spine 2 or 3 views  Result Date: 07/01/2020 Multilevel spondylosis was noted.  Narrowing between C2-C3, C3-C4, C5-C6, C6-C7 was noted.  Anterior spurring was noted.  Facet joint arthropathy was noted. Impression: These findings are consistent with multilevel spondylosis and facet joint arthropathy.  XR KNEE 3 VIEW LEFT  Result Date: 07/01/2020 No medial lateral compartment narrowing was  noted.  Moderate patellofemoral narrowing was noted.  No chondrocalcinosis was noted. Impression: These findings are consistent with moderate chondromalacia patella of the knee.   Recent Labs: Lab Results  Component Value Date   WBC 7.1 06/02/2020   HGB 13.5 06/02/2020   PLT 299 06/02/2020   NA 135 06/02/2020   K 4.1 06/02/2020   CL 100 06/02/2020   CO2 27 06/02/2020   GLUCOSE 77 06/02/2020   BUN 10 06/02/2020   CREATININE 0.56 06/02/2020   BILITOT 0.6 06/02/2020   ALKPHOS 74 03/04/2019   AST 15 06/02/2020   ALT 16 06/02/2020   PROT 7.9 06/02/2020   ALBUMIN 4.0 03/04/2019   CALCIUM 9.2 06/02/2020   GFRAA 127 06/02/2020  June 02, 2020 UA showed 1+ hemoglobin and 1+ leukocytes, ANA 1: 160 cytoplasmic, dsDNA 6 (indeterminate), C3-C4 normal, ESR 38, RF negative, anti-CCP negative, 14 3 3  eta negative, vitamin D 35  Speciality Comments: No specialty comments available.  Procedures:  No procedures performed Allergies:  Patient has no known allergies.   Assessment / Plan:     Visit Diagnoses: Positive ANA (antinuclear antibody) - ANA 1:80 cytoplasmic and dsDNA 5: She experiences infrequent symptoms of Raynaud's.  Her repeat ANA and double-stranded DNA are also low titer positive.  Complements normal.  She has no clinical clinical features of autoimmune disease.  I have advised her to contact me in case she develops any new symptoms.  [name] without gangrene-she gives history of Raynaud's mostly the winter months.  No nailbed capillary changes or sclerodactyly was noted.  She had good capillary refill.  Neck pain -she continues to have neck pain and discomfort.  She also had trapezius spasm.- Plan: XR Cervical Spine 2 or 3 views.  Multilevel spondylosis and facet joint arthropathy was noted.  X-ray findings were discussed with the patient.  C-spine x-ray has been advised.  Pain in thoracic spine -she complains of thoracic pain.  A handout on back exercises was given.-  Plan: XR Thoracic Spine 2 View.  Multilevel spondylosis was noted.  X-ray findings were discussed with the patient.  Exercises were advised.  A handout on exercises was given.  Trapezius muscle spasm-a handout on neck exercises were given.  Chronic pain of both knees -she complains of chronic discomfort in her knee joints.  No warmth swelling or effusion was noted.- Plan: XR KNEE 3 VIEW RIGHT, XR KNEE 3 VIEW LEFT.  Bilateral chondromalacia patella was noted.  Exocytosis was noted on right proximal fibula.  Patient is not tender in that area.  I also reviewed x-rays with Dr. Lin Landsman who said it is a benign lesion.  Lower extremity muscle strengthening exercises were advised.  Other insomnia-good sleep hygiene was discussed.  Fibromyalgia - She has generalized hyperalgesia and positive tender points.  CK is normal.  She was referred to physical therapy at the last visit.  She does not want to move for physical therapy at this point.  Vitamin D deficiency  Family history of rheumatoid arthritis  Orders: Orders Placed This Encounter  Procedures  . XR KNEE 3 VIEW RIGHT  . XR KNEE 3 VIEW LEFT  . XR Thoracic Spine 2 View  . XR Cervical Spine 2 or 3 views   No orders of the defined types were placed in this encounter.     Follow-Up Instructions: Return in about 5 months (around 11/29/2020) for Positive ANA, Raynaud's, fibromyalgia.   Bo Merino, MD  Note - This record has been created using Editor, commissioning.  Chart creation errors have been sought, but may not always  have been located. Such creation errors do not reflect on  the standard of medical care.

## 2020-07-01 ENCOUNTER — Ambulatory Visit: Payer: Self-pay

## 2020-07-01 ENCOUNTER — Encounter: Payer: Self-pay | Admitting: Rheumatology

## 2020-07-01 ENCOUNTER — Other Ambulatory Visit: Payer: Self-pay

## 2020-07-01 ENCOUNTER — Telehealth: Payer: Self-pay | Admitting: Rheumatology

## 2020-07-01 ENCOUNTER — Ambulatory Visit: Payer: 59 | Admitting: Rheumatology

## 2020-07-01 VITALS — BP 132/83 | HR 73 | Resp 14 | Ht 62.0 in | Wt 136.2 lb

## 2020-07-01 DIAGNOSIS — Z8261 Family history of arthritis: Secondary | ICD-10-CM

## 2020-07-01 DIAGNOSIS — R768 Other specified abnormal immunological findings in serum: Secondary | ICD-10-CM

## 2020-07-01 DIAGNOSIS — M25561 Pain in right knee: Secondary | ICD-10-CM | POA: Diagnosis not present

## 2020-07-01 DIAGNOSIS — M25562 Pain in left knee: Secondary | ICD-10-CM

## 2020-07-01 DIAGNOSIS — M542 Cervicalgia: Secondary | ICD-10-CM | POA: Diagnosis not present

## 2020-07-01 DIAGNOSIS — M546 Pain in thoracic spine: Secondary | ICD-10-CM

## 2020-07-01 DIAGNOSIS — I7300 Raynaud's syndrome without gangrene: Secondary | ICD-10-CM | POA: Diagnosis not present

## 2020-07-01 DIAGNOSIS — G8929 Other chronic pain: Secondary | ICD-10-CM

## 2020-07-01 DIAGNOSIS — G4709 Other insomnia: Secondary | ICD-10-CM

## 2020-07-01 DIAGNOSIS — M62838 Other muscle spasm: Secondary | ICD-10-CM

## 2020-07-01 DIAGNOSIS — E559 Vitamin D deficiency, unspecified: Secondary | ICD-10-CM

## 2020-07-01 DIAGNOSIS — M797 Fibromyalgia: Secondary | ICD-10-CM

## 2020-07-01 NOTE — Telephone Encounter (Signed)
I called [facility] to confirm Dr. Corliss Skains reads her own x-rays.

## 2020-07-01 NOTE — Telephone Encounter (Signed)
[facility] calling to see if a Radiologist needs to read the x-rays from today? If you need to talk to a radiologist to confirm Dr. Fatima Sanger assessment, please call [phone].

## 2020-07-01 NOTE — Patient Instructions (Signed)
Journal for Nurse Practitioners, 15(4), 263-267. Retrieved May 19, 2018 from [email or web address].com/[email or web address]">  Knee Exercises Ask your health care provider which exercises are safe for you. Do exercises exactly as told by your health care provider and adjust them as directed. It is normal to feel mild stretching, pulling, tightness, or discomfort as you do these exercises. Stop right away if you feel sudden pain or your pain gets worse. Do not begin these exercises until told by your health care provider. Stretching and range-of-motion exercises These exercises warm up your muscles and joints and improve the movement and flexibility of your knee. These exercises also help to relieve pain and swelling. Knee extension, prone 1. Lie on your abdomen (prone position) on a bed. 2. Place your left / right knee just beyond the edge of the surface so your knee is not on the bed. You can put a towel under your left / right thigh just above your kneecap for comfort. 3. Relax your leg muscles and allow gravity to straighten your knee (extension). You should feel a stretch behind your left / right knee. 4. Hold this position for __________ seconds. 5. Scoot up so your knee is supported between repetitions. Repeat __________ times. Complete this exercise __________ times a day. Knee flexion, active  1. Lie on your back with both legs straight. If this causes back discomfort, bend your left / right knee so your foot is flat on the floor. 2. Slowly slide your left / right heel back toward your buttocks. Stop when you feel a gentle stretch in the front of your knee or thigh (flexion). 3. Hold this position for __________ seconds. 4. Slowly slide your left / right heel back to the starting position. Repeat __________ times. Complete this exercise __________ times a day. Quadriceps stretch, prone  1. Lie on your abdomen on a firm surface, such as a bed or padded floor. 2. Bend your left / right knee and hold  your ankle. If you cannot reach your ankle or pant leg, loop a belt around your foot and grab the belt instead. 3. Gently pull your heel toward your buttocks. Your knee should not slide out to the side. You should feel a stretch in the front of your thigh and knee (quadriceps). 4. Hold this position for __________ seconds. Repeat __________ times. Complete this exercise __________ times a day. Hamstring, supine 1. Lie on your back (supine position). 2. Loop a belt or towel over the ball of your left / right foot. The ball of your foot is on the walking surface, right under your toes. 3. Straighten your left / right knee and slowly pull on the belt to raise your leg until you feel a gentle stretch behind your knee (hamstring). ? Do not let your knee bend while you do this. ? Keep your other leg flat on the floor. 4. Hold this position for __________ seconds. Repeat __________ times. Complete this exercise __________ times a day. Strengthening exercises These exercises build strength and endurance in your knee. Endurance is the ability to use your muscles for a long time, even after they get tired. Quadriceps, isometric This exercise stretches the muscles in front of your thigh (quadriceps) without moving your knee joint (isometric). 1. Lie on your back with your left / right leg extended and your other knee bent. Put a rolled towel or small pillow under your knee if told by your health care provider. 2. Slowly tense the muscles in the front of your left /   right thigh. You should see your kneecap slide up toward your hip or see increased dimpling just above the knee. This motion will push the back of the knee toward the floor. 3. For __________ seconds, hold the muscle as tight as you can without increasing your pain. 4. Relax the muscles slowly and completely. Repeat __________ times. Complete this exercise __________ times a day. Straight leg raises This exercise stretches the muscles in front  of your thigh (quadriceps) and the muscles that move your hips (hip flexors). 1. Lie on your back with your left / right leg extended and your other knee bent. 2. Tense the muscles in the front of your left / right thigh. You should see your kneecap slide up or see increased dimpling just above the knee. Your thigh may even shake a bit. 3. Keep these muscles tight as you raise your leg 4-6 inches (10-15 cm) off the floor. Do not let your knee bend. 4. Hold this position for __________ seconds. 5. Keep these muscles tense as you lower your leg. 6. Relax your muscles slowly and completely after each repetition. Repeat __________ times. Complete this exercise __________ times a day. Hamstring, isometric 1. Lie on your back on a firm surface. 2. Bend your left / right knee about __________ degrees. 3. Dig your left / right heel into the surface as if you are trying to pull it toward your buttocks. Tighten the muscles in the back of your thighs (hamstring) to "dig" as hard as you can without increasing any pain. 4. Hold this position for __________ seconds. 5. Release the tension gradually and allow your muscles to relax completely for __________ seconds after each repetition. Repeat __________ times. Complete this exercise __________ times a day. Hamstring curls If told by your health care provider, do this exercise while wearing ankle weights. Begin with __________ lb weights. Then increase the weight by 1 lb (0.5 kg) increments. Do not wear ankle weights that are more than __________ lb. 1. Lie on your abdomen with your legs straight. 2. Bend your left / right knee as far as you can without feeling pain. Keep your hips flat against the floor. 3. Hold this position for __________ seconds. 4. Slowly lower your leg to the starting position. Repeat __________ times. Complete this exercise __________ times a day. Squats This exercise strengthens the muscles in front of your thigh and knee  (quadriceps). 1. Stand in front of a table, with your feet and knees pointing straight ahead. You may rest your hands on the table for balance but not for support. 2. Slowly bend your knees and lower your hips like you are going to sit in a chair. ? Keep your weight over your heels, not over your toes. ? Keep your lower legs upright so they are parallel with the table legs. ? Do not let your hips go lower than your knees. ? Do not bend lower than told by your health care provider. ? If your knee pain increases, do not bend as low. 3. Hold the squat position for __________ seconds. 4. Slowly push with your legs to return to standing. Do not use your hands to pull yourself to standing. Repeat __________ times. Complete this exercise __________ times a day. Wall slides This exercise strengthens the muscles in front of your thigh and knee (quadriceps). 1. Lean your back against a smooth wall or door, and walk your feet out 18-24 inches (46-61 cm) from it. 2. Place your feet hip-width apart. 3.   Slowly slide down the wall or door until your knees bend __________ degrees. Keep your knees over your heels, not over your toes. Keep your knees in line with your hips. 4. Hold this position for __________ seconds. Repeat __________ times. Complete this exercise __________ times a day. Straight leg raises This exercise strengthens the muscles that rotate the leg at the hip and move it away from your body (hip abductors). 1. Lie on your side with your left / right leg in the top position. Lie so your head, shoulder, knee, and hip line up. You may bend your bottom knee to help you keep your balance. 2. Roll your hips slightly forward so your hips are stacked directly over each other and your left / right knee is facing forward. 3. Leading with your heel, lift your top leg 4-6 inches (10-15 cm). You should feel the muscles in your outer hip lifting. ? Do not let your foot drift forward. ? Do not let your knee  roll toward the ceiling. 4. Hold this position for __________ seconds. 5. Slowly return your leg to the starting position. 6. Let your muscles relax completely after each repetition. Repeat __________ times. Complete this exercise __________ times a day. Straight leg raises This exercise stretches the muscles that move your hips away from the front of the pelvis (hip extensors). 1. Lie on your abdomen on a firm surface. You can put a pillow under your hips if that is more comfortable. 2. Tense the muscles in your buttocks and lift your left / right leg about 4-6 inches (10-15 cm). Keep your knee straight as you lift your leg. 3. Hold this position for __________ seconds. 4. Slowly lower your leg to the starting position. 5. Let your leg relax completely after each repetition. Repeat __________ times. Complete this exercise __________ times a day. This information is not intended to replace advice given to you by your health care provider. Make sure you discuss any questions you have with your health care provider. Document Revised: 05/20/2018 Document Reviewed: 05/20/2018 [employer] Patient Education  2020 [employer] Back Exercises The following exercises strengthen the muscles that help to support the trunk and back. They also help to keep the lower back flexible. Doing these exercises can help to prevent back pain or lessen existing pain.  If you have back pain or discomfort, try doing these exercises 2-3 times each day or as told by your health care provider.  As your pain improves, do them once each day, but increase the number of times that you repeat the steps for each exercise (do more repetitions).  To prevent the recurrence of back pain, continue to do these exercises once each day or as told by your health care provider. Do exercises exactly as told by your health care provider and adjust them as directed. It is normal to feel mild stretching, pulling, tightness, or discomfort as  you do these exercises, but you should stop right away if you feel sudden pain or your pain gets worse. Exercises Single knee to chest Repeat these steps 3-5 times for each leg: 1. Lie on your back on a firm bed or the floor with your legs extended. 2. Bring one knee to your chest. Your other leg should stay extended and in contact with the floor. 3. Hold your knee in place by grabbing your knee or thigh with both hands and hold. 4. Pull on your knee until you feel a gentle stretch in your lower back or   buttocks. 5. Hold the stretch for 10-30 seconds. 6. Slowly release and straighten your leg. Pelvic tilt Repeat these steps 5-10 times: 1. Lie on your back on a firm bed or the floor with your legs extended. 2. Bend your knees so they are pointing toward the ceiling and your feet are flat on the floor. 3. Tighten your lower abdominal muscles to press your lower back against the floor. This motion will tilt your pelvis so your tailbone points up toward the ceiling instead of pointing to your feet or the floor. 4. With gentle tension and even breathing, hold this position for 5-10 seconds. Cat-cow Repeat these steps until your lower back becomes more flexible: 1. Get into a hands-and-knees position on a firm surface. Keep your hands under your shoulders, and keep your knees under your hips. You may place padding under your knees for comfort. 2. Let your head hang down toward your chest. Contract your abdominal muscles and point your tailbone toward the floor so your lower back becomes rounded like the back of a cat. 3. Hold this position for 5 seconds. 4. Slowly lift your head, let your abdominal muscles relax and point your tailbone up toward the ceiling so your back forms a sagging arch like the back of a cow. 5. Hold this position for 5 seconds.  Press-ups Repeat these steps 5-10 times: 1. Lie on your abdomen (face-down) on the floor. 2. Place your palms near your head, about shoulder-width  apart. 3. Keeping your back as relaxed as possible and keeping your hips on the floor, slowly straighten your arms to raise the top half of your body and lift your shoulders. Do not use your back muscles to raise your upper torso. You may adjust the placement of your hands to make yourself more comfortable. 4. Hold this position for 5 seconds while you keep your back relaxed. 5. Slowly return to lying flat on the floor.  Bridges Repeat these steps 10 times: 1. Lie on your back on a firm surface. 2. Bend your knees so they are pointing toward the ceiling and your feet are flat on the floor. Your arms should be flat at your sides, next to your body. 3. Tighten your buttocks muscles and lift your buttocks off the floor until your waist is at almost the same height as your knees. You should feel the muscles working in your buttocks and the back of your thighs. If you do not feel these muscles, slide your feet 1-2 inches farther away from your buttocks. 4. Hold this position for 3-5 seconds. 5. Slowly lower your hips to the starting position, and allow your buttocks muscles to relax completely. If this exercise is too easy, try doing it with your arms crossed over your chest. Abdominal crunches Repeat these steps 5-10 times: 1. Lie on your back on a firm bed or the floor with your legs extended. 2. Bend your knees so they are pointing toward the ceiling and your feet are flat on the floor. 3. Cross your arms over your chest. 4. Tip your chin slightly toward your chest without bending your neck. 5. Tighten your abdominal muscles and slowly raise your trunk (torso) high enough to lift your shoulder blades a tiny bit off the floor. Avoid raising your torso higher than that because it can put too much stress on your low back and does not help to strengthen your abdominal muscles. 6. Slowly return to your starting position. Back lifts Repeat these steps 5-10 times:   1. Lie on your abdomen (face-down)  with your arms at your sides, and rest your forehead on the floor. 2. Tighten the muscles in your legs and your buttocks. 3. Slowly lift your chest off the floor while you keep your hips pressed to the floor. Keep the back of your head in line with the curve in your back. Your eyes should be looking at the floor. 4. Hold this position for 3-5 seconds. 5. Slowly return to your starting position. Contact a health care provider if:  Your back pain or discomfort gets much worse when you do an exercise.  Your worsening back pain or discomfort does not lessen within 2 hours after you exercise. If you have any of these problems, stop doing these exercises right away. Do not do them again unless your health care provider says that you can. Get help right away if:  You develop sudden, severe back pain. If this happens, stop doing the exercises right away. Do not do them again unless your health care provider says that you can. This information is not intended to replace advice given to you by your health care provider. Make sure you discuss any questions you have with your health care provider. Document Revised: 12/04/2018 Document Reviewed: 05/01/2018 [employer] Patient Education  2020 [employer] Cervical Strain and Sprain Rehab Ask your health care provider which exercises are safe for you. Do exercises exactly as told by your health care provider and adjust them as directed. It is normal to feel mild stretching, pulling, tightness, or discomfort as you do these exercises. Stop right away if you feel sudden pain or your pain gets worse. Do not begin these exercises until told by your health care provider. Stretching and range-of-motion exercises Cervical side bending  6. Using good posture, sit on a stable chair or stand up. 7. Without moving your shoulders, slowly tilt your left / right ear to your shoulder until you feel a stretch in the opposite side neck muscles. You should be looking straight  ahead. 8. Hold for __________ seconds. 9. Repeat with the other side of your neck. Repeat __________ times. Complete this exercise __________ times a day. Cervical rotation  5. Using good posture, sit on a stable chair or stand up. 6. Slowly turn your head to the side as if you are looking over your left / right shoulder. ? Keep your eyes level with the ground. ? Stop when you feel a stretch along the side and the back of your neck. 7. Hold for __________ seconds. 8. Repeat this by turning to your other side. Repeat __________ times. Complete this exercise __________ times a day. Thoracic extension and pectoral stretch 5. Roll a towel or a small blanket so it is about 4 inches (10 cm) in diameter. 6. Lie down on your back on a firm surface. 7. Put the towel lengthwise, under your spine in the middle of your back. It should not be under your shoulder blades. The towel should line up with your spine from your middle back to your lower back. 8. Put your hands behind your head and let your elbows fall out to your sides. 9. Hold for __________ seconds. Repeat __________ times. Complete this exercise __________ times a day. Strengthening exercises Isometric upper cervical flexion 5. Lie on your back with a thin pillow behind your head and a small rolled-up towel under your neck. 6. Gently tuck your chin toward your chest and nod your head down to look toward your feet.  Do not lift your head off the pillow. 7. Hold for __________ seconds. 8. Release the tension slowly. Relax your neck muscles completely before you repeat this exercise. Repeat __________ times. Complete this exercise __________ times a day. Isometric cervical extension  5. Stand about 6 inches (15 cm) away from a wall, with your back facing the wall. 6. Place a soft object, about 6-8 inches (15-20 cm) in diameter, between the back of your head and the wall. A soft object could be a small pillow, a ball, or a folded  towel. 7. Gently tilt your head back and press into the soft object. Keep your jaw and forehead relaxed. 8. Hold for __________ seconds. 9. Release the tension slowly. Relax your neck muscles completely before you repeat this exercise. Repeat __________ times. Complete this exercise __________ times a day. Posture and body mechanics Body mechanics refers to the movements and positions of your body while you do your daily activities. Posture is part of body mechanics. Good posture and healthy body mechanics can help to relieve stress in your body's tissues and joints. Good posture means that your spine is in its natural S-curve position (your spine is neutral), your shoulders are pulled back slightly, and your head is not tipped forward. The following are general guidelines for applying improved posture and body mechanics to your everyday activities. Sitting  7. When sitting, keep your spine neutral and keep your feet flat on the floor. Use a footrest, if necessary, and keep your thighs parallel to the floor. Avoid rounding your shoulders, and avoid tilting your head forward. 8. When working at a desk or a computer, keep your desk at a height where your hands are slightly lower than your elbows. Slide your chair under your desk so you are close enough to maintain good posture. 9. When working at a computer, place your monitor at a height where you are looking straight ahead and you do not have to tilt your head forward or downward to look at the screen. Standing   When standing, keep your spine neutral and keep your feet about hip-width apart. Keep a slight bend in your knees. Your ears, shoulders, and hips should line up.  When you do a task in which you stand in one place for a long time, place one foot up on a stable object that is 2-4 inches (5-10 cm) high, such as a footstool. This helps keep your spine neutral. Resting When lying down and resting, avoid positions that are most painful for  you. Try to support your neck in a neutral position. You can use a contour pillow or a small rolled-up towel. Your pillow should support your neck but not push on it. This information is not intended to replace advice given to you by your health care provider. Make sure you discuss any questions you have with your health care provider. Document Revised: 11/19/2018 Document Reviewed: 04/30/2018 Elsevier Patient Education  2020 [employer]  

## 2020-08-25 ENCOUNTER — Other Ambulatory Visit: Payer: Self-pay

## 2020-08-25 ENCOUNTER — Telehealth (INDEPENDENT_AMBULATORY_CARE_PROVIDER_SITE_OTHER): Payer: 59 | Admitting: Internal Medicine

## 2020-08-25 DIAGNOSIS — J029 Acute pharyngitis, unspecified: Secondary | ICD-10-CM

## 2020-08-25 MED ORDER — DOXYCYCLINE HYCLATE 100 MG PO TABS: 100.0000 mg | ORAL_TABLET | Freq: Two times a day (BID) | ORAL | 0 refills | Status: DC

## 2020-08-25 NOTE — Progress Notes (Signed)
Patient ID: Kimberly Horne, female   DOB: September 08, 1970, 50 y.o.   MRN: [id number]  Virtual Visit via Video Note  I connected with Kimberly Horne on 08/25/20 at 10:00 AM EST by a video enabled telemedicine application and verified that I am speaking with the correct person using two identifiers.  Location of all participants today Patient: at home Provider: at office   I discussed the limitations of evaluation and management by telemedicine and the availability of in person appointments. The patient expressed understanding and agreed to proceed.  History of Present Illness: Here with 3 days onset severe ST; has rapid covid then pcr covid testing neg since onset of symptoms, no one else sick at home, but with mild HA, heavy head sensation, slight cough at night non prod, chills and small ear fullness onset today.  Pt denies chest pain, increased sob or doe, wheezing, orthopnea, PND, increased LE swelling, palpitations, dizziness or syncope.   Pt denies polydipsia, polyuria Past Medical History:  Diagnosis Date  . Osteoarthritis    Past Surgical History:  Procedure Laterality Date  . CHOLECYSTECTOMY  2002  . COCCYX REMOVAL  1997  . HAND SURGERY Right    cyst removed    reports that she has never smoked. She has never used smokeless tobacco. She reports previous alcohol use. She reports that she does not use drugs. family history includes Diabetes in her father; Healthy in her brother, brother, daughter, daughter, and sister; Hyperlipidemia in her mother; Hypertension in her mother. No Known Allergies Current Outpatient Medications on File Prior to Visit  Medication Sig Dispense Refill  . CALCIUM PO Take by mouth daily.    . Cholecalciferol (VITAMIN D) 50 MCG (2000 UT) CAPS Take by mouth daily.    . Ginger, Zingiber officinalis, (GINGER ROOT PO) Take by mouth.    . Multiple Vitamin (MULTIVITAMIN PO) Take by mouth daily.    . Omega-3 Fatty Acids (FISH OIL PO) Take by mouth.     . TURMERIC PO Take by mouth.     No current facility-administered medications on file prior to visit.    Observations/Objective: Alert, NAD, appropriate mood and affect, resps normal, cn 2-12 intact, moves all 4s, no visible rash or swelling Lab Results  Component Value Date   WBC 7.1 06/02/2020   HGB 13.5 06/02/2020   HCT 41.2 06/02/2020   PLT 299 06/02/2020   GLUCOSE 77 06/02/2020   CHOL 161 10/24/2017   TRIG 244.0 (H) 10/24/2017   HDL 42.40 10/24/2017   LDLDIRECT 107.0 10/24/2017   LDLCALC 91 01/24/2016   ALT 16 06/02/2020   AST 15 06/02/2020   NA 135 06/02/2020   K 4.1 06/02/2020   CL 100 06/02/2020   CREATININE 0.56 06/02/2020   BUN 10 06/02/2020   CO2 27 06/02/2020   TSH 3.81 03/04/2019   HGBA1C 5.5 10/24/2017    Assessment and Plan: See notes  Follow Up Instructions: See notes   I discussed the assessment and treatment plan with the patient. The patient was provided an opportunity to ask questions and all were answered. The patient agreed with the plan and demonstrated an understanding of the instructions.   The patient was advised to call back or seek an in-person evaluation if the symptoms worsen or if the condition fails to improve as anticipated.   Oliver Barre, MD

## 2020-08-28 ENCOUNTER — Encounter: Payer: Self-pay | Admitting: Internal Medicine

## 2020-08-28 DIAGNOSIS — J029 Acute pharyngitis, unspecified: Secondary | ICD-10-CM | POA: Insufficient documentation

## 2020-08-28 NOTE — Assessment & Plan Note (Addendum)
Mild to mod, for antibx course, and mucinex bid prn otc, to f/u any worsening symptoms or concerns

## 2020-08-28 NOTE — Patient Instructions (Signed)
Please take all new medication as prescribed 

## 2020-09-05 NOTE — Progress Notes (Signed)
   Covid-[facility] Vaccination [facility]  Name:  Kimberly Horne    MRN: [id number] DOB: 1971-05-21  09/05/2020  Kimberly Horne was observed post Covid-19 immunization for 15 minutes without incident. She was provided with Vaccine Information Sheet and instruction to access the V-Safe system.   Kimberly Horne was instructed to call 911 with any severe reactions post vaccine: Marland Kitchen Difficulty breathing  . Swelling of face and throat  . A fast heartbeat  . A bad rash all over body  . Dizziness and weakness   Immunizations Administered    Name Date Dose VIS Date Route   Moderna COVID-19 Vaccine 06/17/2020 -- -- --   Moderna Covid-19 Booster Vaccine 06/17/2020 12:25 PM 0.25 mL 06/01/2020 Intramuscular   Manufacturer: Moderna   Lot: [id number]   NDC: 67014-103-01

## 2020-09-20 IMAGING — MG DIGITAL SCREENING BILATERAL MAMMOGRAM WITH TOMO AND CAD
8 series · 9 of 24 positions shown · non-contrast
Comparison: Previous exam(s).

CLINICAL DATA: Screening.

EXAM:
DIGITAL SCREENING BILATERAL MAMMOGRAM WITH TOMO AND CAD

[R MLO synth-2D]
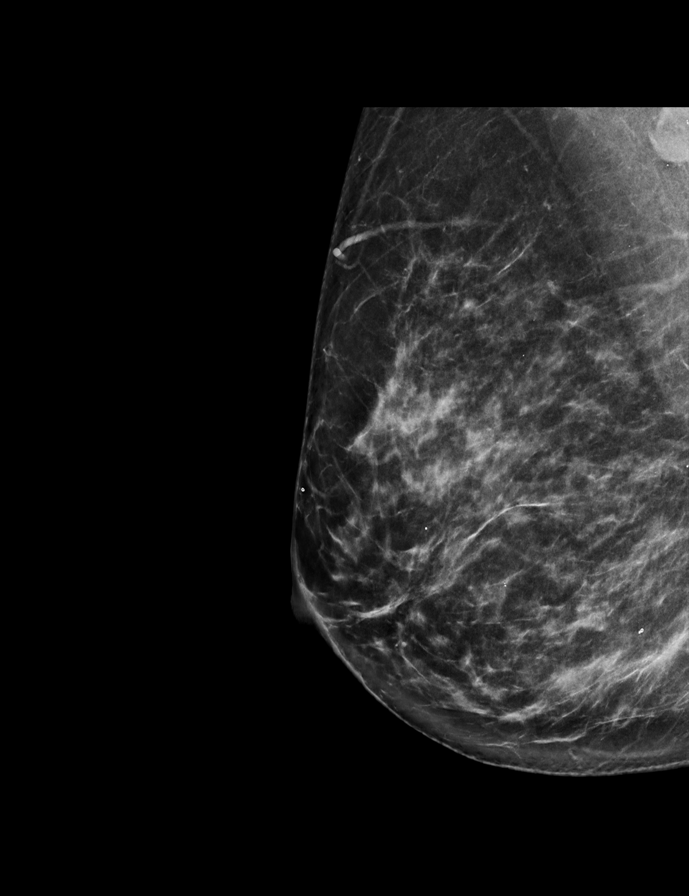

[R CC synth-2D]
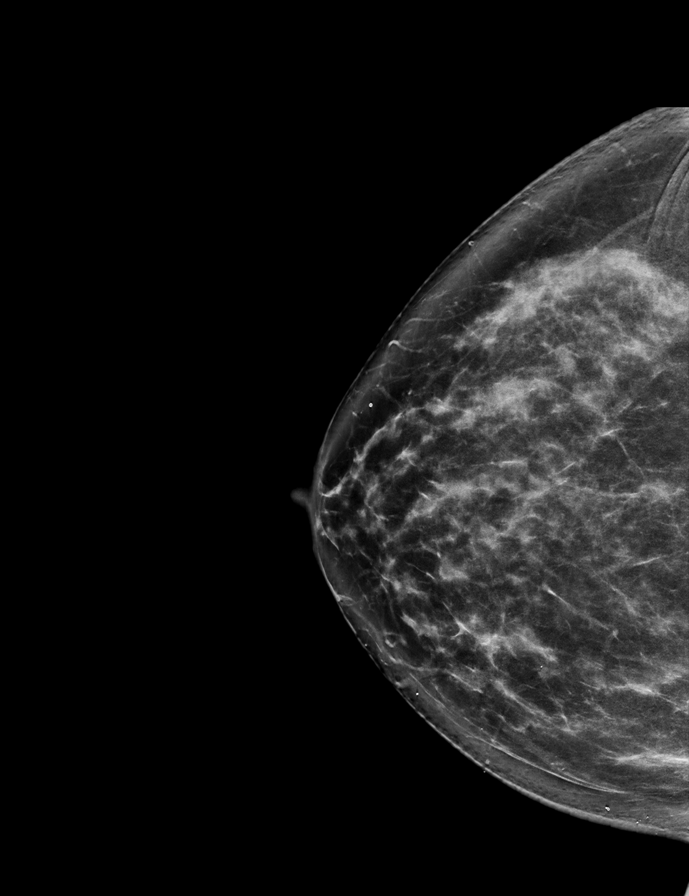

[L CC synth-2D]
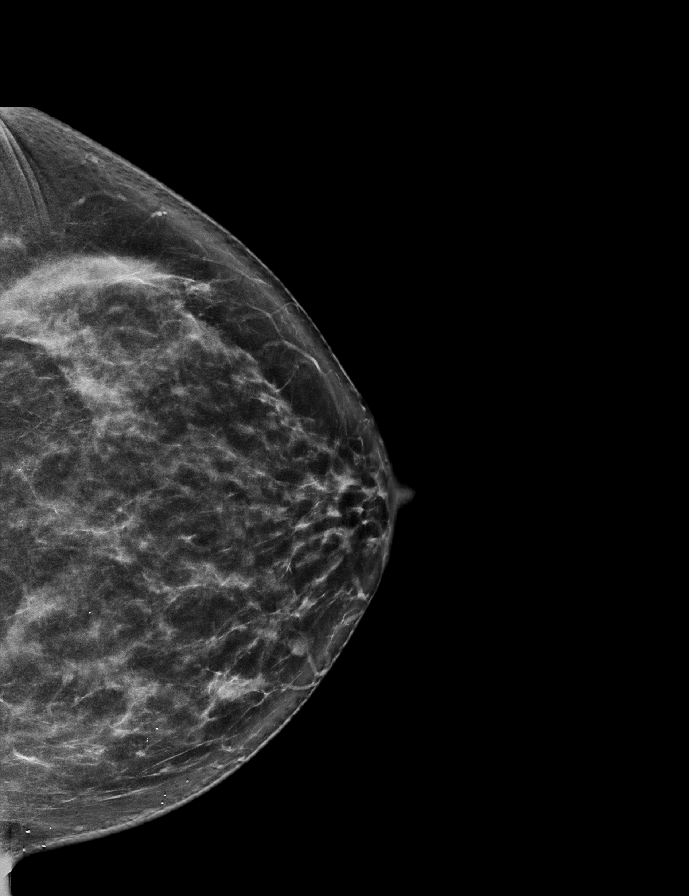

[L MLO synth-2D]
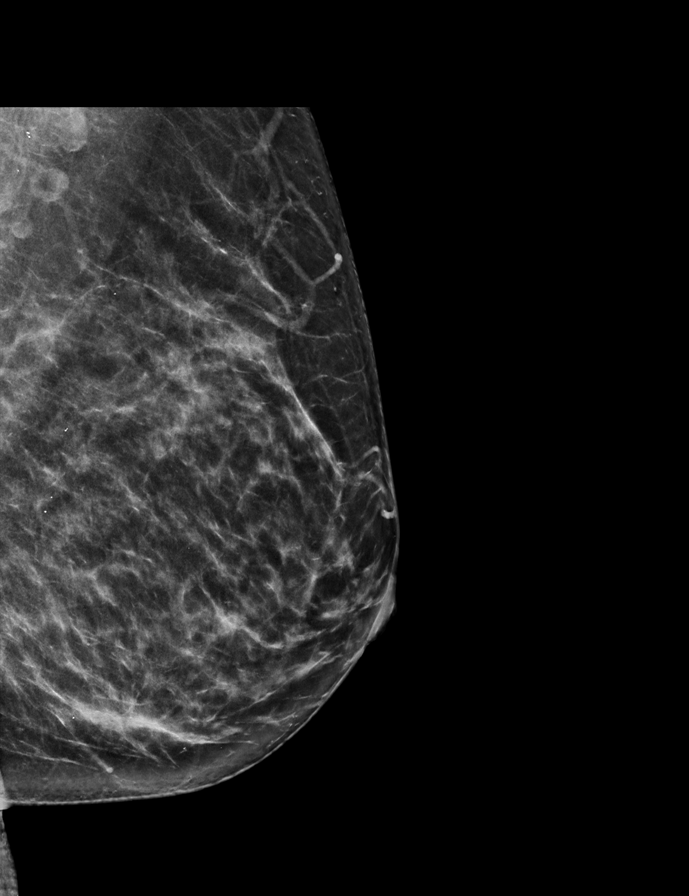

[R MLO tomo · 2 of 74 frames shown]
[frame 24/74]
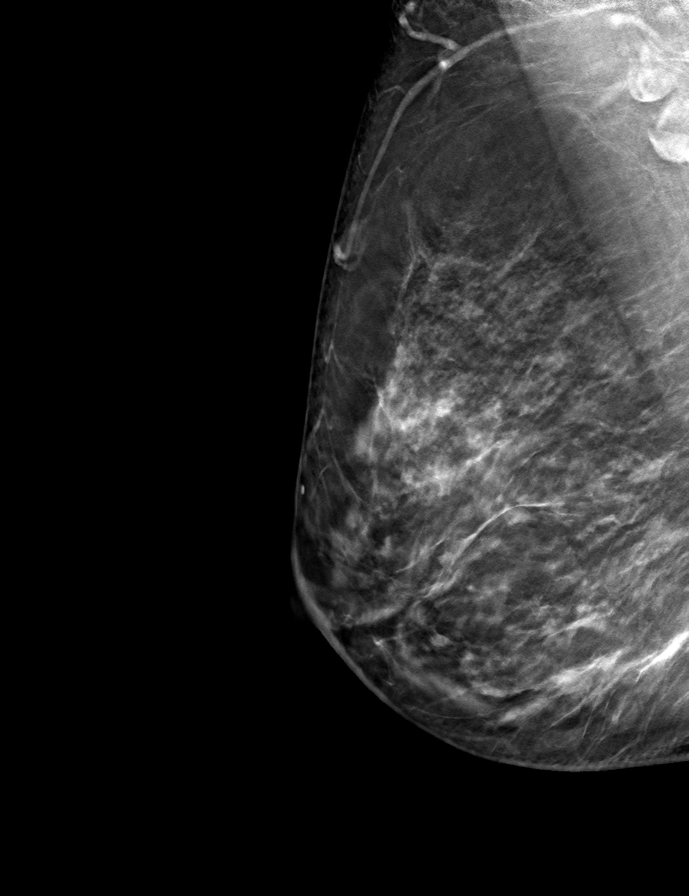
[frame 37/74]
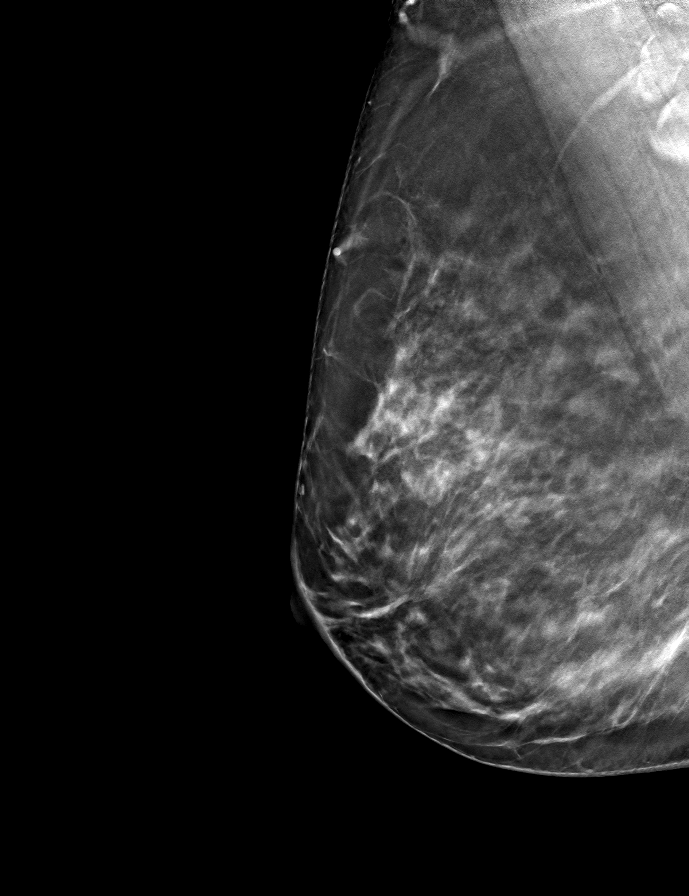

[L CC tomo · tomo slice 35/70.0]
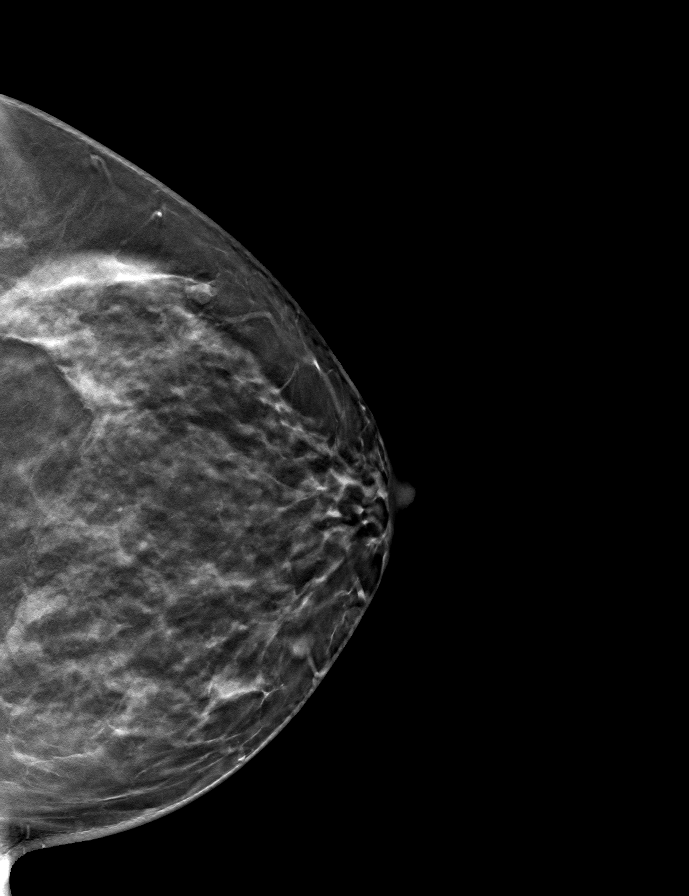

[R CC tomo · tomo slice 38/75.0]
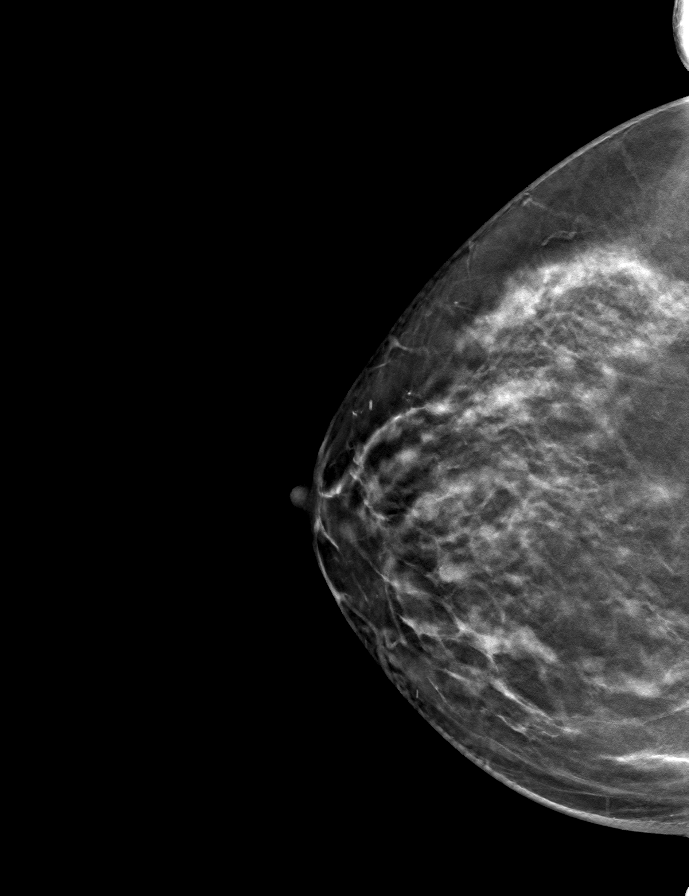

[L MLO tomo · tomo slice 37/72.0]
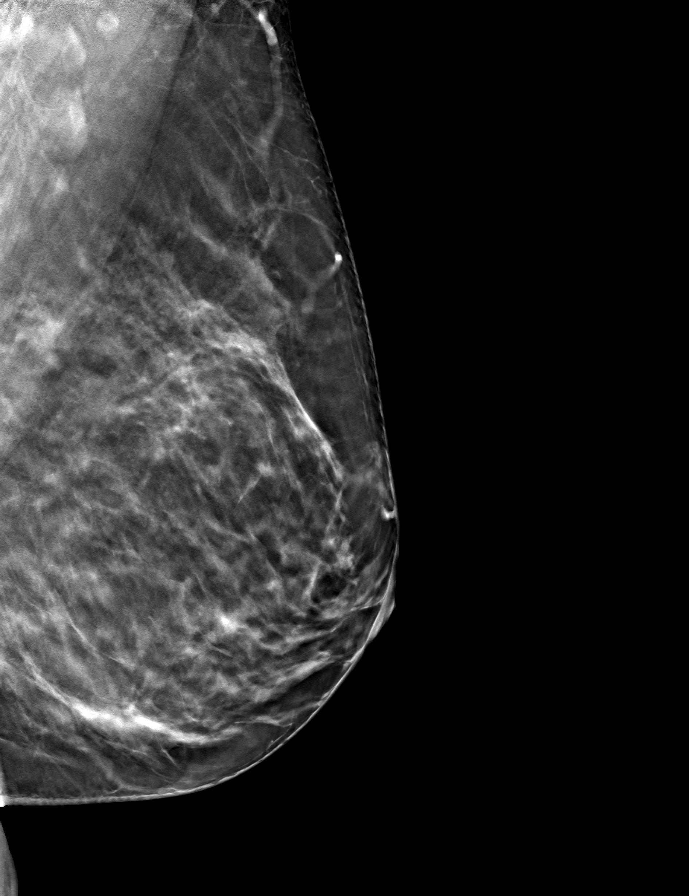

[9 of 24 positions shown; findings below may reference images not displayed]

ACR Breast Density Category c: The breast tissue is heterogeneously
dense, which may obscure small masses.
FINDINGS: In the left breast, a possible mass and possible asymmetry warrant
further evaluation. In the right breast, no findings suspicious for
malignancy.

Images were processed with CAD.
IMPRESSION: Further evaluation is suggested for possible mass and asymmetry in
the left breast.

RECOMMENDATION:
Diagnostic mammogram and possibly ultrasound of the left breast.
(Code:[id number])

The patient will be contacted regarding the findings, and additional
imaging will be scheduled.

BI-RADS CATEGORY  0: Incomplete. Need additional imaging evaluation
and/or prior mammograms for comparison.

## 2020-11-24 NOTE — Progress Notes (Signed)
Office Visit Note  Patient: Kimberly Horne             Date of Birth: March 22, 1971           MRN: [id number]             PCP: Myrlene Broker, MD Referring: Myrlene Broker, * Visit Date: 12/08/2020 Occupation: @GUAROCC @  Subjective:  Other (Left thumb pain, left foot pain )   History of Present Illness: Kimberly Horne is a 50 y.o. female with a history of positive ANA and indeterminate double-stranded DNA.  She continues to have mild Raynaud's symptoms.  She denies any history of oral ulcers, nasal ulcers, malar rash, photosensitivity, sicca symptoms, rash, inflammatory arthritis.  She states she has some discomfort over the left [facility] joint and also her left first MTP joint.  She is on computer all day at work.  She states the left first MTP hurts when she is walking barefoot.  She has not noticed any joint swelling.  She continues to have some neck stiffness and discomfort.  Activities of Daily Living:  Patient reports morning stiffness for 5 minutes.   Patient Reports nocturnal pain.  Difficulty dressing/grooming: Denies Difficulty climbing stairs: Denies Difficulty getting out of chair: Denies Difficulty using hands for taps, buttons, cutlery, and/or writing: Denies  Review of Systems  Constitutional: Positive for fatigue.  HENT: Negative for mouth sores, mouth dryness and nose dryness.   Eyes: Negative for pain, itching and dryness.  Respiratory: Negative for shortness of breath and difficulty breathing.   Cardiovascular: Negative for chest pain and palpitations.  Gastrointestinal: Negative for blood in stool, constipation and diarrhea.  Endocrine: Negative for increased urination.  Genitourinary: Negative for difficulty urinating.  Musculoskeletal: Positive for arthralgias, joint pain, myalgias, morning stiffness, muscle tenderness and myalgias. Negative for joint swelling.  Skin: Positive for color change. Negative for rash and redness.   Allergic/Immunologic: Negative for susceptible to infections.  Neurological: Positive for numbness. Negative for dizziness, headaches, memory loss and weakness.  Hematological: Negative for bruising/bleeding tendency.  Psychiatric/Behavioral: Positive for sleep disturbance. Negative for confusion.    PMFS History:  Patient Active Problem List   Diagnosis Date Noted  . Pharyngitis 08/28/2020  . Vitamin D deficiency 06/02/2020  . LLQ pain 10/24/2017  . Routine general medical examination at a health care facility 01/24/2016    Past Medical History:  Diagnosis Date  . Osteoarthritis     Family History  Problem Relation Age of Onset  . Hyperlipidemia Mother   . Hypertension Mother   . Diabetes Father   . Healthy Sister   . Healthy Brother   . Healthy Brother   . Healthy Daughter   . Healthy Daughter    Past Surgical History:  Procedure Laterality Date  . CHOLECYSTECTOMY  2002  . COCCYX REMOVAL  1997  . HAND SURGERY Right    cyst removed   Social History   Social History Narrative  . Not on file   Immunization History  Administered Date(s) Administered  . Moderna SARS-COV2 Booster Vaccination 06/17/2020  . Moderna Sars-Covid-2 Vaccination 09/24/2019, 10/27/2019, 06/17/2020  . PPD Test 12/11/2017  . Tdap 02/24/2013     Objective: Vital Signs: BP 126/84 (BP Location: Left Arm, Patient Position: Sitting, Cuff Size: Normal)   Pulse 73   Resp 13   Ht 5\' 2"  (1.575 m)   Wt 138 lb (62.6 kg)   BMI 25.24 kg/m    Physical Exam Vitals and nursing note reviewed.  Constitutional:      Appearance: She is well-developed.  HENT:     Head: Normocephalic and atraumatic.  Eyes:     Conjunctiva/sclera: Conjunctivae normal.  Cardiovascular:     Rate and Rhythm: Normal rate and regular rhythm.     Heart sounds: Normal heart sounds.  Pulmonary:     Effort: Pulmonary effort is normal.     Breath sounds: Normal breath sounds.  Abdominal:     General: Bowel sounds are  normal.     Palpations: Abdomen is soft.  Musculoskeletal:     Cervical back: Normal range of motion.  Lymphadenopathy:     Cervical: No cervical adenopathy.  Skin:    General: Skin is warm and dry.     Capillary Refill: Capillary refill takes less than 2 seconds.  Neurological:     Mental Status: She is alert and oriented to person, place, and time.  Psychiatric:        Behavior: Behavior normal.      Musculoskeletal Exam: C-spine thoracic and lumbar spine were in good range of motion.  She had bilateral trapezius spasm.  Shoulder joints, elbow joints, wrist joints, MCPs PIPs and DIPs with good range of motion.  She has some tenderness on palpation of her left CMC joint.  Hip joints, knee joints, ankles, MTPs and PIPs with good range of motion.  She has prominence of left first MTP joint.  CDAI Exam: CDAI Score: -- Patient Global: --; Provider Global: -- Swollen: --; Tender: -- Joint Exam 12/08/2020   No joint exam has been documented for this visit   There is currently no information documented on the homunculus. Go to the Rheumatology activity and complete the homunculus joint exam.  Investigation: No additional findings.  Imaging: No results found.  Recent Labs: Lab Results  Component Value Date   WBC 7.1 06/02/2020   HGB 13.5 06/02/2020   PLT 299 06/02/2020   NA 135 06/02/2020   K 4.1 06/02/2020   CL 100 06/02/2020   CO2 27 06/02/2020   GLUCOSE 77 06/02/2020   BUN 10 06/02/2020   CREATININE 0.56 06/02/2020   BILITOT 0.6 06/02/2020   ALKPHOS 74 03/04/2019   AST 15 06/02/2020   ALT 16 06/02/2020   PROT 7.9 06/02/2020   ALBUMIN 4.0 03/04/2019   CALCIUM 9.2 06/02/2020   GFRAA 127 06/02/2020    Speciality Comments: No specialty comments available.  Procedures:  No procedures performed Allergies: Patient has no known allergies.   Assessment / Plan:     Visit Diagnoses: Positive ANA (antinuclear antibody) - ANA 1:80 cytoplasmic and dsDNA 5: She  experiences infrequent symptoms of Raynaud's.  Her repeat ANA and double-stranded DNA are also low titer positive.  She continues to have mild Raynaud's symptoms.  She denies any history of oral ulcers, nasal ulcers, malar rash, photosensitivity, inflammatory arthritis or lymphadenopathy.  She had good capillary refill on my examination.  No nailbed capillary changes were noted.  No sclerodactyly was noted.  I will obtain [facility] labs.  I will contact her once the lab results are available.  She has been advised to contact us in case she develops new symptoms.  [name] without gangrene-she has been trying to keep the core temperature warm.  Neck pain - Multilevel spondylosis and facet joint arthropathy was noted.  She continues to have some neck discomfort.  Trapezius muscle spasm-I have given her a handout on for neck stretching exercises.  Arthritis of carpometacarpal (CMC) joint of left thumb-she  is complaining of left CMC discomfort.  Have given her a brace for left CMC joint and also some hand muscle strengthening exercises and joint protection was discussed.  Pain in left foot-she had discomfort over left first MTP joint.  She had no synovitis on examination.  No tenderness on examination.  She has discomfort when she walks barefoot.  Fitting shoes with extra support were discussed.  Other insomnia-good sleep hygiene was discussed.  Fibromyalgia-she continues to have some discomfort from fibromyalgia.  She has positive tender points and some hyperalgesia.  Vitamin D deficiency-her last vitamin D level was normal.  She has been advised to continue to take vitamin D supplement.  Family history of rheumatoid arthritis  Orders: No orders of the defined types were placed in this encounter.  No orders of the defined types were placed in this encounter.  .  Follow-Up Instructions: Return in about 6 months (around 06/09/2021) for Rynauds, +ANA.   Pollyann Savoy, MD  Note -  This record has been created using Animal nutritionist.  Chart creation errors have been sought, but may not always  have been located. Such creation errors do not reflect on  the standard of medical care.

## 2020-12-08 ENCOUNTER — Ambulatory Visit (INDEPENDENT_AMBULATORY_CARE_PROVIDER_SITE_OTHER): Payer: BC Managed Care – PPO | Admitting: Rheumatology

## 2020-12-08 ENCOUNTER — Encounter: Payer: Self-pay | Admitting: Rheumatology

## 2020-12-08 ENCOUNTER — Other Ambulatory Visit: Payer: Self-pay

## 2020-12-08 VITALS — BP 126/84 | HR 73 | Resp 13 | Ht 62.0 in | Wt 138.0 lb

## 2020-12-08 DIAGNOSIS — M542 Cervicalgia: Secondary | ICD-10-CM | POA: Diagnosis not present

## 2020-12-08 DIAGNOSIS — I7300 Raynaud's syndrome without gangrene: Secondary | ICD-10-CM | POA: Diagnosis not present

## 2020-12-08 DIAGNOSIS — Z8261 Family history of arthritis: Secondary | ICD-10-CM

## 2020-12-08 DIAGNOSIS — M62838 Other muscle spasm: Secondary | ICD-10-CM | POA: Diagnosis not present

## 2020-12-08 DIAGNOSIS — G4709 Other insomnia: Secondary | ICD-10-CM

## 2020-12-08 DIAGNOSIS — M546 Pain in thoracic spine: Secondary | ICD-10-CM

## 2020-12-08 DIAGNOSIS — R768 Other specified abnormal immunological findings in serum: Secondary | ICD-10-CM | POA: Diagnosis not present

## 2020-12-08 DIAGNOSIS — E559 Vitamin D deficiency, unspecified: Secondary | ICD-10-CM

## 2020-12-08 DIAGNOSIS — M797 Fibromyalgia: Secondary | ICD-10-CM

## 2020-12-08 DIAGNOSIS — M79672 Pain in left foot: Secondary | ICD-10-CM

## 2020-12-08 DIAGNOSIS — M1812 Unilateral primary osteoarthritis of first carpometacarpal joint, left hand: Secondary | ICD-10-CM

## 2020-12-08 DIAGNOSIS — G8929 Other chronic pain: Secondary | ICD-10-CM

## 2020-12-08 NOTE — Patient Instructions (Signed)
Hand Exercises Hand exercises can be helpful for almost anyone. These exercises can strengthen the hands, improve flexibility and movement, and increase blood flow to the hands. These results can make work and daily tasks easier. Hand exercises can be especially helpful for people who have joint pain from arthritis or have nerve damage from overuse (carpal tunnel syndrome). These exercises can also help people who have injured a hand. Exercises Most of these hand exercises are gentle stretching and motion exercises. It is usually safe to do them often throughout the day. Warming up your hands before exercise may help to reduce stiffness. You can do this with gentle massage or by placing your hands in warm water for 10-15 minutes. It is normal to feel some stretching, pulling, tightness, or mild discomfort as you begin new exercises. This will gradually improve. Stop an exercise right away if you feel sudden, severe pain or your pain gets worse. Ask your health care provider which exercises are best for you. Knuckle bend or "claw" fist 1. Stand or sit with your arm, hand, and all five fingers pointed straight up. Make sure to keep your wrist straight during the exercise. 2. Gently bend your fingers down toward your palm until the tips of your fingers are touching the top of your palm. Keep your big knuckle straight and just bend the small knuckles in your fingers. 3. Hold this position for __________ seconds. 4. Straighten (extend) your fingers back to the starting position. Repeat this exercise 5-10 times with each hand. Full finger fist 1. Stand or sit with your arm, hand, and all five fingers pointed straight up. Make sure to keep your wrist straight during the exercise. 2. Gently bend your fingers into your palm until the tips of your fingers are touching the middle of your palm. 3. Hold this position for __________ seconds. 4. Extend your fingers back to the starting position, stretching every  joint fully. Repeat this exercise 5-10 times with each hand. Straight fist 1. Stand or sit with your arm, hand, and all five fingers pointed straight up. Make sure to keep your wrist straight during the exercise. 2. Gently bend your fingers at the big knuckle, where your fingers meet your hand, and the middle knuckle. Keep the knuckle at the tips of your fingers straight and try to touch the bottom of your palm. 3. Hold this position for __________ seconds. 4. Extend your fingers back to the starting position, stretching every joint fully. Repeat this exercise 5-10 times with each hand. Tabletop 1. Stand or sit with your arm, hand, and all five fingers pointed straight up. Make sure to keep your wrist straight during the exercise. 2. Gently bend your fingers at the big knuckle, where your fingers meet your hand, as far down as you can while keeping the small knuckles in your fingers straight. Think of forming a tabletop with your fingers. 3. Hold this position for __________ seconds. 4. Extend your fingers back to the starting position, stretching every joint fully. Repeat this exercise 5-10 times with each hand. Finger spread 1. Place your hand flat on a table with your palm facing down. Make sure your wrist stays straight as you do this exercise. 2. Spread your fingers and thumb apart from each other as far as you can until you feel a gentle stretch. Hold this position for __________ seconds. 3. Bring your fingers and thumb tight together again. Hold this position for __________ seconds. Repeat this exercise 5-10 times with each hand.   Making circles 1. Stand or sit with your arm, hand, and all five fingers pointed straight up. Make sure to keep your wrist straight during the exercise. 2. Make a circle by touching the tip of your thumb to the tip of your index finger. 3. Hold for __________ seconds. Then open your hand wide. 4. Repeat this motion with your thumb and each finger on your  hand. Repeat this exercise 5-10 times with each hand. Thumb motion 1. Sit with your forearm resting on a table and your wrist straight. Your thumb should be facing up toward the ceiling. Keep your fingers relaxed as you move your thumb. 2. Lift your thumb up as high as you can toward the ceiling. Hold for __________ seconds. 3. Bend your thumb across your palm as far as you can, reaching the tip of your thumb for the small finger (pinkie) side of your palm. Hold for __________ seconds. Repeat this exercise 5-10 times with each hand. Grip strengthening 1. Hold a stress ball or other soft ball in the middle of your hand. 2. Slowly increase the pressure, squeezing the ball as much as you can without causing pain. Think of bringing the tips of your fingers into the middle of your palm. All of your finger joints should bend when doing this exercise. 3. Hold your squeeze for __________ seconds, then relax. Repeat this exercise 5-10 times with each hand.   Contact a health care provider if:  Your hand pain or discomfort gets much worse when you do an exercise.  Your hand pain or discomfort does not improve within 2 hours after you exercise. If you have any of these problems, stop doing these exercises right away. Do not do them again unless your health care provider says that you can. Get help right away if:  You develop sudden, severe hand pain or swelling. If this happens, stop doing these exercises right away. Do not do them again unless your health care provider says that you can. This information is not intended to replace advice given to you by your health care provider. Make sure you discuss any questions you have with your health care provider. Document Revised: 11/20/2018 Document Reviewed: 07/31/2018 Elsevier Patient Education  2021 [employer] Cervical Strain and Sprain Rehab Ask your health care provider which exercises are safe for you. Do exercises exactly as told by your health  care provider and adjust them as directed. It is normal to feel mild stretching, pulling, tightness, or discomfort as you do these exercises. Stop right away if you feel sudden pain or your pain gets worse. Do not begin these exercises until told by your health care provider. Stretching and range-of-motion exercises Cervical side bending 1. Using good posture, sit on a stable chair or stand up. 2. Without moving your shoulders, slowly tilt your left / right ear to your shoulder until you feel a stretch in the opposite side neck muscles. You should be looking straight ahead. 3. Hold for __________ seconds. 4. Repeat with the other side of your neck. Repeat __________ times. Complete this exercise __________ times a day.   Cervical rotation 1. Using good posture, sit on a stable chair or stand up. 2. Slowly turn your head to the side as if you are looking over your left / right shoulder. ? Keep your eyes level with the ground. ? Stop when you feel a stretch along the side and the back of your neck. 3. Hold for __________ seconds. 4. Repeat this by  turning to your other side. Repeat __________ times. Complete this exercise __________ times a day.   Thoracic extension and pectoral stretch 1. Roll a towel or a small blanket so it is about 4 inches (10 cm) in diameter. 2. Lie down on your back on a firm surface. 3. Put the towel lengthwise, under your spine in the middle of your back. It should not be under your shoulder blades. The towel should line up with your spine from your middle back to your lower back. 4. Put your hands behind your head and let your elbows fall out to your sides. 5. Hold for __________ seconds. Repeat __________ times. Complete this exercise __________ times a day. Strengthening exercises Isometric upper cervical flexion 1. Lie on your back with a thin pillow behind your head and a small rolled-up towel under your neck. 2. Gently tuck your chin toward your chest and nod  your head down to look toward your feet. Do not lift your head off the pillow. 3. Hold for __________ seconds. 4. Release the tension slowly. Relax your neck muscles completely before you repeat this exercise. Repeat __________ times. Complete this exercise __________ times a day. Isometric cervical extension 1. Stand about 6 inches (15 cm) away from a wall, with your back facing the wall. 2. Place a soft object, about 6-8 inches (15-20 cm) in diameter, between the back of your head and the wall. A soft object could be a small pillow, a ball, or a folded towel. 3. Gently tilt your head back and press into the soft object. Keep your jaw and forehead relaxed. 4. Hold for __________ seconds. 5. Release the tension slowly. Relax your neck muscles completely before you repeat this exercise. Repeat __________ times. Complete this exercise __________ times a day.   Posture and body mechanics Body mechanics refers to the movements and positions of your body while you do your daily activities. Posture is part of body mechanics. Good posture and healthy body mechanics can help to relieve stress in your body's tissues and joints. Good posture means that your spine is in its natural S-curve position (your spine is neutral), your shoulders are pulled back slightly, and your head is not tipped forward. The following are general guidelines for applying improved posture and body mechanics to your everyday activities. Sitting 1. When sitting, keep your spine neutral and keep your feet flat on the floor. Use a footrest, if necessary, and keep your thighs parallel to the floor. Avoid rounding your shoulders, and avoid tilting your head forward. 2. When working at a desk or a computer, keep your desk at a height where your hands are slightly lower than your elbows. Slide your chair under your desk so you are close enough to maintain good posture. 3. When working at a computer, place your monitor at a height where you  are looking straight ahead and you do not have to tilt your head forward or downward to look at the screen.   Standing  When standing, keep your spine neutral and keep your feet about hip-width apart. Keep a slight bend in your knees. Your ears, shoulders, and hips should line up.  When you do a task in which you stand in one place for a long time, place one foot up on a stable object that is 2-4 inches (5-10 cm) high, such as a footstool. This helps keep your spine neutral.   Resting When lying down and resting, avoid positions that are most painful for you.   Try to support your neck in a neutral position. You can use a contour pillow or a small rolled-up towel. Your pillow should support your neck but not push on it. This information is not intended to replace advice given to you by your health care provider. Make sure you discuss any questions you have with your health care provider. Document Revised: 11/19/2018 Document Reviewed: 04/30/2018 Elsevier Patient Education  2021 [employer]  

## 2020-12-13 DIAGNOSIS — R768 Other specified abnormal immunological findings in serum: Secondary | ICD-10-CM | POA: Diagnosis not present

## 2020-12-13 DIAGNOSIS — Z8261 Family history of arthritis: Secondary | ICD-10-CM | POA: Diagnosis not present

## 2020-12-13 DIAGNOSIS — I7300 Raynaud's syndrome without gangrene: Secondary | ICD-10-CM | POA: Diagnosis not present

## 2021-02-10 ENCOUNTER — Encounter: Payer: Self-pay | Admitting: Rheumatology

## 2021-03-03 DIAGNOSIS — Z1389 Encounter for screening for other disorder: Secondary | ICD-10-CM | POA: Diagnosis not present

## 2021-03-03 DIAGNOSIS — Z1231 Encounter for screening mammogram for malignant neoplasm of breast: Secondary | ICD-10-CM | POA: Diagnosis not present

## 2021-03-03 DIAGNOSIS — Z01419 Encounter for gynecological examination (general) (routine) without abnormal findings: Secondary | ICD-10-CM | POA: Diagnosis not present

## 2021-03-03 DIAGNOSIS — Z130 Encounter for screening for diseases of the blood and blood-forming organs and certain disorders involving the immune mechanism: Secondary | ICD-10-CM | POA: Diagnosis not present

## 2021-03-03 DIAGNOSIS — Z6824 Body mass index (BMI) 24.0-24.9, adult: Secondary | ICD-10-CM | POA: Diagnosis not present

## 2021-03-16 ENCOUNTER — Other Ambulatory Visit: Payer: Self-pay | Admitting: Obstetrics and Gynecology

## 2021-03-16 DIAGNOSIS — R928 Other abnormal and inconclusive findings on diagnostic imaging of breast: Secondary | ICD-10-CM

## 2021-04-11 DIAGNOSIS — I7300 Raynaud's syndrome without gangrene: Secondary | ICD-10-CM | POA: Diagnosis not present

## 2021-04-11 DIAGNOSIS — Z1211 Encounter for screening for malignant neoplasm of colon: Secondary | ICD-10-CM | POA: Diagnosis not present

## 2021-04-11 DIAGNOSIS — K219 Gastro-esophageal reflux disease without esophagitis: Secondary | ICD-10-CM | POA: Diagnosis not present

## 2021-04-27 ENCOUNTER — Ambulatory Visit
Admission: RE | Admit: 2021-04-27 | Discharge: 2021-04-27 | Disposition: A | Discharge status: Home / Self Care | Payer: BC Managed Care – PPO | Source: Ambulatory Visit | Attending: Obstetrics and Gynecology | Admitting: Obstetrics and Gynecology

## 2021-04-27 ENCOUNTER — Other Ambulatory Visit: Payer: Self-pay

## 2021-04-27 DIAGNOSIS — R928 Other abnormal and inconclusive findings on diagnostic imaging of breast: Secondary | ICD-10-CM

## 2021-04-27 DIAGNOSIS — N6011 Diffuse cystic mastopathy of right breast: Secondary | ICD-10-CM | POA: Diagnosis not present

## 2021-04-27 DIAGNOSIS — R922 Inconclusive mammogram: Secondary | ICD-10-CM | POA: Diagnosis not present

## 2021-06-01 NOTE — Progress Notes (Signed)
Office Visit Note  Patient: Kimberly Horne             Date of Birth: 12/31/1970           MRN: [id number]             PCP: Myrlene Broker, MD Referring: Myrlene Broker, * Visit Date: 06/15/2021 Occupation: @GUAROCC @  Subjective:  Pain in all the joints and muscles and Raynauds.   History of Present Illness: Kimberly Horne is a 50 y.o. female with a history of osteoarthritis, fibromyalgia syndrome, Raynaud's and positive ANA.  She states she continues to have cold hands with redness, sometimes discoloration and burning sensation in her hands.  She continues to have some generalized pain and discomfort.  She states the pain is worse during the winter months.  She has neck pain and stiffness and trapezius spasm.  She continues to have some discomfort in her hands and feet.  She has not noticed any joint swelling.There is no history of t oral ulcers, nasal ulcers,Sicca symptoms, malar rash, photosensitivity or lymphadenopathy.  Activities of Daily Living:  Patient reports morning stiffness for all day. Patient Reports nocturnal pain.  Difficulty dressing/grooming: Reports Difficulty climbing stairs: Reports Difficulty getting out of chair: Reports Difficulty using hands for taps, buttons, cutlery, and/or writing: Denies  Review of Systems  Constitutional:  Positive for fatigue.  HENT:  Negative for mouth sores, mouth dryness and nose dryness.   Eyes:  Negative for pain, itching and dryness.  Respiratory:  Negative for shortness of breath and difficulty breathing.   Cardiovascular:  Negative for chest pain and palpitations.  Gastrointestinal:  Negative for blood in stool, constipation and diarrhea.  Endocrine: Negative for increased urination.  Genitourinary:  Negative for difficulty urinating.  Musculoskeletal:  Positive for joint pain, joint pain, myalgias, muscle weakness, morning stiffness, muscle tenderness and myalgias. Negative for joint swelling.   Skin:  Positive for color change. Negative for rash, redness and sensitivity to sunlight.  Allergic/Immunologic: Negative for susceptible to infections.  Neurological:  Negative for dizziness, numbness, headaches, memory loss and weakness.  Hematological:  Negative for bruising/bleeding tendency and swollen glands.  Psychiatric/Behavioral:  Negative for depressed mood, confusion and sleep disturbance. The patient is not nervous/anxious.    PMFS History:  Patient Active Problem List   Diagnosis Date Noted   [name] without gangrene 06/15/2021   Arthritis of carpometacarpal [facility]) joint of left thumb 06/15/2021   Fibromyalgia 06/15/2021   Pharyngitis 08/28/2020   Vitamin D deficiency 06/02/2020   LLQ pain 10/24/2017   Routine general medical examination at a health care facility 01/24/2016    Past Medical History:  Diagnosis Date   Osteoarthritis     Family History  Problem Relation Age of Onset   Hyperlipidemia Mother    Hypertension Mother    Diabetes Father    Healthy Sister    Healthy Brother    Healthy Brother    Healthy Daughter    Healthy Daughter    Past Surgical History:  Procedure Laterality Date   CHOLECYSTECTOMY  2002   COCCYX REMOVAL  1997   HAND SURGERY Right    cyst removed   Social History   Social History Narrative   Not on file   Immunization History  Administered Date(s) Administered   Moderna SARS-COV2 Booster Vaccination 06/17/2020   Moderna Sars-Covid-2 Vaccination 09/24/2019, 10/27/2019, 06/17/2020   PPD Test 12/11/2017   Tdap 02/24/2013     Objective: Vital Signs: BP 124/77 (  BP Location: Left Arm, Patient Position: Sitting, Cuff Size: Normal)   Pulse 73   Ht 5\' 2"  (1.575 m)   Wt 135 lb 3.2 oz (61.3 kg)   BMI 24.73 kg/m    Physical Exam Vitals and nursing note reviewed.  Constitutional:      Appearance: She is well-developed.  HENT:     Head: Normocephalic and atraumatic.  Eyes:     Conjunctiva/sclera: Conjunctivae  normal.  Cardiovascular:     Rate and Rhythm: Normal rate and regular rhythm.     Heart sounds: Normal heart sounds.  Pulmonary:     Effort: Pulmonary effort is normal.     Breath sounds: Normal breath sounds.  Abdominal:     General: Bowel sounds are normal.     Palpations: Abdomen is soft.  Musculoskeletal:     Cervical back: Normal range of motion.  Lymphadenopathy:     Cervical: No cervical adenopathy.  Skin:    General: Skin is warm and dry.     Capillary Refill: Capillary refill takes less than 2 seconds.     Comments: No nailbed capillary changes were noted.  No sclerodactyly or telangiectasias were noted.  She had good capillary refill.  Neurological:     Mental Status: She is alert and oriented to person, place, and time.  Psychiatric:        Behavior: Behavior normal.     Musculoskeletal Exam: C-spine thoracic and lumbar spine were in good range of motion.  She had bilateral trapezius spasm.  Shoulder joints, elbow joints, wrist joints, MCPs PIPs and DIPs with good range of motion without synovitis.  Hip joints, knee joints, ankles, MTPs and PIPs with good range of motion with no synovitis.  CDAI Exam: CDAI Score: -- Patient Global: --; Provider Global: -- Swollen: --; Tender: -- Joint Exam 06/15/2021   No joint exam has been documented for this visit   There is currently no information documented on the homunculus. Go to the Rheumatology activity and complete the homunculus joint exam.  Investigation: No additional findings.  Imaging: No results found.  Recent Labs: Lab Results  Component Value Date   WBC 7.1 06/02/2020   HGB 13.5 06/02/2020   PLT 299 06/02/2020   NA 135 06/02/2020   K 4.1 06/02/2020   CL 100 06/02/2020   CO2 27 06/02/2020   GLUCOSE 77 06/02/2020   BUN 10 06/02/2020   CREATININE 0.56 06/02/2020   BILITOT 0.6 06/02/2020   ALKPHOS 74 03/04/2019   AST 15 06/02/2020   ALT 16 06/02/2020   PROT 7.9 06/02/2020   ALBUMIN 4.0 03/04/2019    CALCIUM 9.2 06/02/2020   GFRAA 127 06/02/2020   Third 2022AVISE lupus index -1.9, ANA 1: 320NS, ENA negative, Jo 1 negative, CB CAP negative, anticardiolipin negative, beta-2 GP 1 negative, antiphosphatidylserine negative, antihistone negative, and RF negative, anti-CCP negative, anti-Car P-, antithyroglobulin negative, anti-TPO negative   Speciality Comments: No specialty comments available.  Procedures:  No procedures performed Allergies: Patient has no known allergies.   Assessment / Plan:     Visit Diagnoses: Positive ANA (antinuclear antibody) - ANA 1:80 cytoplasmic and dsDNA 5: AVISE labs obtained in May 2022 were reviewed.  She has positive ANA, ENA panel was completely negative.  All of the Ro serology was negative.  She denies any history of oral ulcers, nasal ulcers, malar rash, photosensitivity, sicca symptoms or lymphadenopathy.  There is no history of inflammatory arthritis.  [name] without gangrene-she gives history of discoloration in  her hands and burning sensation in her hands with very well.  I discussed warm clothing and keeping the core temperature warm.  I also discussed adding Norvasc.  Indications side effects contraindications were discussed.  She was given a prescription of Norvasc 5 mg p.o. daily.  Side effects were discussed.  She is also advised to monitor blood pressure closely.  We will see response to Normix over the next few months.  Neck pain - Multilevel spondylosis and facet joint arthropathy was noted.  She continues to have stiffness in her neck.  Stretching exercises were discussed.  Trapezius muscle spasm-had bilateral trapezius spasm.  Stretching exercises were emphasized.  Arthritis of carpometacarpal (CMC) joint of left thumb-she has discomfort off and on.  Joint protection was discussed.  Other insomnia-good sleep hygiene was discussed.  Fibromyalgia-she continues to have generalized pain and positive tender points.  I will refer  her to integrative therapies.  Vitamin D deficiency-she takes vitamin D 2000 units daily.  Family history of rheumatoid arthritis  Orders: No orders of the defined types were placed in this encounter.  Meds ordered this encounter  Medications   amLODipine (NORVASC) 5 MG tablet    Sig: Take 1 tablet (5 mg total) by mouth daily.    Dispense:  30 tablet    Refill:  2      Follow-Up Instructions: Return in about 2 months (around 08/15/2021) for Raynaud's,OA.   Pollyann Savoy, MD  Note - This record has been created using Animal nutritionist.  Chart creation errors have been sought, but may not always  have been located. Such creation errors do not reflect on  the standard of medical care.

## 2021-06-15 ENCOUNTER — Other Ambulatory Visit: Payer: Self-pay

## 2021-06-15 ENCOUNTER — Encounter: Payer: Self-pay | Admitting: Rheumatology

## 2021-06-15 ENCOUNTER — Ambulatory Visit: Payer: BC Managed Care – PPO | Admitting: Rheumatology

## 2021-06-15 VITALS — BP 124/77 | HR 73 | Ht 62.0 in | Wt 135.2 lb

## 2021-06-15 DIAGNOSIS — I7300 Raynaud's syndrome without gangrene: Secondary | ICD-10-CM | POA: Insufficient documentation

## 2021-06-15 DIAGNOSIS — M797 Fibromyalgia: Secondary | ICD-10-CM

## 2021-06-15 DIAGNOSIS — E559 Vitamin D deficiency, unspecified: Secondary | ICD-10-CM

## 2021-06-15 DIAGNOSIS — M542 Cervicalgia: Secondary | ICD-10-CM

## 2021-06-15 DIAGNOSIS — R768 Other specified abnormal immunological findings in serum: Secondary | ICD-10-CM | POA: Diagnosis not present

## 2021-06-15 DIAGNOSIS — M79672 Pain in left foot: Secondary | ICD-10-CM

## 2021-06-15 DIAGNOSIS — M1812 Unilateral primary osteoarthritis of first carpometacarpal joint, left hand: Secondary | ICD-10-CM

## 2021-06-15 DIAGNOSIS — G4709 Other insomnia: Secondary | ICD-10-CM

## 2021-06-15 DIAGNOSIS — M62838 Other muscle spasm: Secondary | ICD-10-CM

## 2021-06-15 DIAGNOSIS — Z8261 Family history of arthritis: Secondary | ICD-10-CM

## 2021-06-15 MED ORDER — AMLODIPINE BESYLATE 5 MG PO TABS: 5.0000 mg | ORAL_TABLET | Freq: Every day | ORAL | 2 refills | Status: DC

## 2021-06-15 NOTE — Addendum Note (Signed)
Addended by: Ellen Henri on: 06/15/2021 10:34 AM   Modules accepted: Orders

## 2021-06-27 ENCOUNTER — Telehealth: Payer: Self-pay

## 2021-06-27 NOTE — Telephone Encounter (Signed)
Patient left a voicemail stating she stopped taking the medication over the weekend that Dr. Corliss Skains prescribed on 06/15/21.  She states it was making her throat itchy and caused her to cough a lot.

## 2021-06-27 NOTE — Telephone Encounter (Signed)
Attempted to contact the patient and unable to leave a message, voicemail not set up.  

## 2021-06-27 NOTE — Telephone Encounter (Signed)
We can discuss other treatment options at the follow-up visit.  If she wants to start another prescription prior to her next appointment then we can schedule an earlier appointment.

## 2021-06-27 NOTE — Telephone Encounter (Signed)
Patient was prescribe Amlodipine on 06/15/2021 for [name] without gangrene. Patient states she stopped the medication because it was causing coughing and her throat to itch. Patient states since stopping the medication these side effects have stopped. Please advise.

## 2021-06-28 NOTE — Telephone Encounter (Signed)
Spoke with patient and advised we can discuss other treatment options at the follow-up visit.  Patient advised if she wants to start another prescription prior to her next appointment then we can schedule an earlier appointment. Patient declines to schedule an earlier appointment at this time.

## 2021-08-29 ENCOUNTER — Encounter: Payer: Self-pay | Admitting: Internal Medicine

## 2021-08-29 ENCOUNTER — Other Ambulatory Visit: Payer: Self-pay

## 2021-08-29 ENCOUNTER — Ambulatory Visit: Payer: BC Managed Care – PPO | Admitting: Internal Medicine

## 2021-08-29 DIAGNOSIS — H9203 Otalgia, bilateral: Secondary | ICD-10-CM

## 2021-08-29 MED ORDER — NEOMYCIN-POLYMYXIN-HC 1 % OT SOLN: 3.0000 [drp] | Freq: Three times a day (TID) | OTIC | 0 refills | Status: DC

## 2021-08-29 MED ORDER — MONTELUKAST SODIUM 10 MG PO TABS: 10.0000 mg | ORAL_TABLET | Freq: Every day | ORAL | 3 refills | Status: DC

## 2021-08-29 NOTE — Progress Notes (Signed)
° °  Subjective:   Patient ID: Kimberly Horne, female    DOB: [birth year], 51 y.o.   MRN: [id number]  HPI The patient is a 51 YO female coming in for ear pain. Last seen 2019.   Review of Systems  Constitutional: Negative.   HENT:  Positive for congestion, ear discharge and ear pain.   Eyes: Negative.   Respiratory:  Negative for cough, chest tightness and shortness of breath.   Cardiovascular:  Negative for chest pain, palpitations and leg swelling.  Gastrointestinal:  Negative for abdominal distention, abdominal pain, constipation, diarrhea, nausea and vomiting.  Musculoskeletal: Negative.   Skin: Negative.   Neurological: Negative.   Psychiatric/Behavioral: Negative.     Objective:  Physical Exam Constitutional:      Appearance: She is well-developed.  HENT:     Head: Normocephalic and atraumatic.     Comments: TM bulging clear fluid right, TM bulging cloudy fluid left Cardiovascular:     Rate and Rhythm: Normal rate and regular rhythm.  Pulmonary:     Effort: Pulmonary effort is normal. No respiratory distress.     Breath sounds: Normal breath sounds. No wheezing or rales.  Abdominal:     General: Bowel sounds are normal. There is no distension.     Palpations: Abdomen is soft.     Tenderness: There is no abdominal tenderness. There is no rebound.  Musculoskeletal:     Cervical back: Normal range of motion.  Skin:    General: Skin is warm and dry.  Neurological:     Mental Status: She is alert and oriented to person, place, and time.     Coordination: Coordination normal.    Vitals:   08/29/21 0939  BP: 118/80  Pulse: 74  Resp: 18  SpO2: 98%  Weight: 134 lb (60.8 kg)  Height: 5\' 2"  (1.575 m)    This visit occurred during the SARS-CoV-2 public health emergency.  Safety protocols were in place, including screening questions prior to the visit, additional usage of staff PPE, and extensive cleaning of exam room while observing appropriate contact time as  indicated for disinfecting solutions.   Assessment & Plan:

## 2021-08-29 NOTE — Patient Instructions (Signed)
We have sent in the ear drops to use 3 drops in each ear 3 times a day for 3 days.  We have also sent in singulair to take 1 pill daily as needed for allergies.

## 2021-08-31 ENCOUNTER — Ambulatory Visit: Payer: BC Managed Care – PPO | Admitting: Internal Medicine

## 2021-09-01 DIAGNOSIS — H9209 Otalgia, unspecified ear: Secondary | ICD-10-CM | POA: Insufficient documentation

## 2021-09-01 NOTE — Assessment & Plan Note (Signed)
Rx cortisporin bilaterally and rx singulair for ongoing allergies issues and otc is not strong enough for her.

## 2021-09-01 NOTE — Progress Notes (Signed)
Office Visit Note  Patient: Kimberly Horne             Date of Birth: 1970-11-09           MRN: [id number]             PCP: Hoyt Koch, MD Referring: Hoyt Koch, * Visit Date: 09/14/2021 Occupation: @GUAROCC @  Subjective:  Back pain   History of Present Illness: Kimberly Horne is a 51 y.o. female with history of positive ANA and fibromyalgia.  Patient presents today for follow-up of Raynaud's syndrome.  At her last office visit on 06/15/2021 she was started on amlodipine.  She tried taking amlodipine for 1 week but discontinued due to GI upset.  She has been experiencing less frequent and less severe symptoms of Raynaud's.  She has been wearing her glasses and has been trying to avoid triggers.  She is also changed her diet.  She continues to take fish oil, turmeric, and ginger.  She denies any skin tightness or thickening.  She has not had any recent rashes.  She denies any oral or nasal ulcerations.  She has not had any sicca symptoms.  She denies any shortness of breath or pleuritic chest pain.   She has chronic pain in her lower back.  At her last office visit she was referred to innovative therapies but she did not schedule appointment at that time due to the holidays.  She would like a new referral to integrative therapies.  She denies any other joint pain or joint swelling.    Activities of Daily Living:  Patient reports morning stiffness for 10 minutes.   Patient Reports nocturnal pain.  Difficulty dressing/grooming: Denies Difficulty climbing stairs: Denies Difficulty getting out of chair: Denies Difficulty using hands for taps, buttons, cutlery, and/or writing: Denies  Review of Systems  Constitutional:  Positive for fatigue.  HENT:  Negative for mouth sores, mouth dryness and nose dryness.   Eyes:  Negative for pain, itching and dryness.  Respiratory:  Negative for shortness of breath and difficulty breathing.   Cardiovascular:  Negative  for chest pain and palpitations.  Gastrointestinal:  Negative for blood in stool, constipation and diarrhea.  Endocrine: Negative for increased urination.  Genitourinary:  Negative for difficulty urinating.  Musculoskeletal:  Positive for joint pain, joint pain, myalgias, morning stiffness, muscle tenderness and myalgias. Negative for joint swelling.  Skin:  Negative for color change, rash and redness.  Allergic/Immunologic: Negative for susceptible to infections.  Neurological:  Negative for dizziness, numbness, headaches, memory loss and weakness.  Hematological:  Negative for bruising/bleeding tendency.  Psychiatric/Behavioral:  Positive for sleep disturbance. Negative for confusion.    PMFS History:  Patient Active Problem List   Diagnosis Date Noted   Ear pain 09/01/2021   [name] without gangrene 06/15/2021   Arthritis of carpometacarpal [facility]) joint of left thumb 06/15/2021   Fibromyalgia 06/15/2021   Pharyngitis 08/28/2020   Vitamin D deficiency 06/02/2020   LLQ pain 10/24/2017   Routine general medical examination at a health care facility 01/24/2016    Past Medical History:  Diagnosis Date   Osteoarthritis     Family History  Problem Relation Age of Onset   Hyperlipidemia Mother    Hypertension Mother    Diabetes Father    Healthy Sister    Healthy Brother    Healthy Brother    Healthy Daughter    Healthy Daughter    Past Surgical History:  Procedure Laterality Date  CHOLECYSTECTOMY  2002   COCCYX REMOVAL  1997   HAND SURGERY Right    cyst removed   Social History   Social History Narrative   Not on file   Immunization History  Administered Date(s) Administered   Moderna SARS-COV2 Booster Vaccination 06/17/2020   Moderna Sars-Covid-2 Vaccination 09/24/2019, 10/27/2019, 06/17/2020   PPD Test 12/11/2017   Tdap 02/24/2013     Objective: Vital Signs: BP 126/79 (BP Location: Left Arm, Patient Position: Sitting, Cuff Size: Normal)    Pulse  81    Ht 5\' 1"  (1.549 m)    Wt 133 lb 12.8 oz (60.7 kg)    BMI 25.28 kg/m    Physical Exam Vitals and nursing note reviewed.  Constitutional:      Appearance: She is well-developed.  HENT:     Head: Normocephalic and atraumatic.  Eyes:     Conjunctiva/sclera: Conjunctivae normal.  Pulmonary:     Effort: Pulmonary effort is normal.  Abdominal:     Palpations: Abdomen is soft.  Musculoskeletal:     Cervical back: Normal range of motion.  Skin:    General: Skin is warm and dry.     Capillary Refill: Capillary refill takes less than 2 seconds.     Comments: No signs of sclerodactyly.  No digital ulcerations or signs of gangrene.   No telangiectasias noted.   Neurological:     Mental Status: She is alert and oriented to person, place, and time.  Psychiatric:        Behavior: Behavior normal.     Musculoskeletal Exam: C-spine has good range of motion with no discomfort.  No midline spinal tenderness noted.  Shoulder joints, elbow joints, wrist joints, MCPs, PIPs, DIPs have good range of motion with no synovitis.  Complete fist formation bilaterally.  Hip joints have good range of motion with no groin pain.  Knee joints have good range of motion with no warmth or effusion.  Ankle joints have good range of motion with no tenderness or joint swelling.  CDAI Exam: CDAI Score: -- Patient Global: --; Provider Global: -- Swollen: --; Tender: -- Joint Exam 09/14/2021   No joint exam has been documented for this visit   There is currently no information documented on the homunculus. Go to the Rheumatology activity and complete the homunculus joint exam.  Investigation: No additional findings.  Imaging: No results found.  Recent Labs: Lab Results  Component Value Date   WBC 7.1 06/02/2020   HGB 13.5 06/02/2020   PLT 299 06/02/2020   NA 135 06/02/2020   K 4.1 06/02/2020   CL 100 06/02/2020   CO2 27 06/02/2020   GLUCOSE 77 06/02/2020   BUN 10 06/02/2020   CREATININE 0.56  06/02/2020   BILITOT 0.6 06/02/2020   ALKPHOS 74 03/04/2019   AST 15 06/02/2020   ALT 16 06/02/2020   PROT 7.9 06/02/2020   ALBUMIN 4.0 03/04/2019   CALCIUM 9.2 06/02/2020   GFRAA 127 06/02/2020    Speciality Comments: No specialty comments available.  Procedures:  No procedures performed Allergies: Patient has no known allergies.   Assessment / Plan:     Visit Diagnoses: Positive ANA (antinuclear antibody) - Positive ANA 1:80 cytoplasmic, dsDNA 5, ENA panel was completely negative.  She is not exhibiting any signs or symptoms of systemic lupus or scleroderma at this time.  No signs of inflammatory arthritis were noted.  She was advised to notify us if she develops any new or worsening symptoms.  She will  follow-up in the office in 6 months and we will repeat lab work at that time.  [name] without gangrene: She has had less frequent and less severe symptoms of Raynaud's since her last office visit.  On 06/15/2021 the plan was to start her on Norvasc 5 mg daily.  She tried taking Norvasc for about 1 week and discontinued due to GI upset.  She has been wearing gloves and trying to keep her core body temperature warm.  She has been trying to avoid triggers. On exam she had good capillary refill less than 2 seconds.  No signs of sclerodactyly, digital ulcerations, or gangrene were noted.  Discussed the importance of avoiding exposure to cold temperatures, tobacco smoke, and extreme emotional stress.  She was advised to notify us if she develops any new or worsening symptoms.  Spondylosis without myelopathy or radiculopathy, cervical region: X-rays of the C-spine on 07/01/2020 revealed findings consistent with multilevel spondylosis and facet joint arthropathy.  She continues to experience intermittent discomfort in her neck especially at night.  She has not experienced any symptoms of radiculopathy at this time.  Referral to integrative therapies was placed at her last office visit  but she did not schedule appointment at that time.  She requested a new referral to be placed today.  Trapezius muscle spasm: She has trapezius muscle tension and tenderness bilaterally.  She experiences muscle spasms intermittently.  She has had massages in the past which provided temporary relief.  Referral to integrative therapies was placed today.  Spondylosis of cervicothoracic spine: X-rays of the thoracic spine were updated on 07/01/2020 revealing findings consistent with multilevel spondylosis.  She continues to have chronic pain and stiffness in her neck and back.  A referral to integrative therapies was placed today.   Arthritis of carpometacarpal (CMC) joint of left thumb: She has some tenderness to palpation over the left CMC joint.  No inflammation was noted.  She was able to make a complete fist bilaterally.  She continues to take ginger, fish oil, and turmeric on a daily basis for the natural anti-inflammatory properties.  Other insomnia: She has been having interrupted sleep at night due to nocturnal pain in her neck and lower back.  Fibromyalgia: She experiences intermittent myalgias and muscle tenderness due to fibromyalgia.  Her discomfort has been worse severe in her neck and lower back.  She experiences muscle spasms intermittently.  She has been walking on a treadmill on a daily basis for exercise.  Referral to integrative therapies was placed at her last office visit but she did not schedule appointment due to the holidays.  She requested a new referral to negative therapies to be placed today. Discussed the importance of regular exercise and good sleep hygiene.  Chondromalacia of both patellae: X-rays of both knees were obtained on 07/01/2020 which revealed evidence of chondromalacia patella.  She has been taking turmeric, ginger, and fish oil for the natural anti-inflammatory properties.  She has been walking on a treadmill daily for exercise.  Other medical conditions are  listed as follows:  Vitamin D deficiency: She is taking vitamin D supplement daily.  Family history of rheumatoid arthritis  Orders: No orders of the defined types were placed in this encounter.  No orders of the defined types were placed in this encounter.   Follow-Up Instructions: Return in about 6 months (around 03/14/2022) for +ANA, Fibromyalgia.   Ofilia Neas, PA-C  Note - This record has been created using Dragon software.  Chart creation  errors have been sought, but may not always  have been located. Such creation errors do not reflect on  the standard of medical care.

## 2021-09-14 ENCOUNTER — Encounter: Payer: Self-pay | Admitting: Physician Assistant

## 2021-09-14 ENCOUNTER — Other Ambulatory Visit: Payer: Self-pay

## 2021-09-14 ENCOUNTER — Ambulatory Visit: Payer: BC Managed Care – PPO | Admitting: Physician Assistant

## 2021-09-14 VITALS — BP 126/79 | HR 81 | Ht 61.0 in | Wt 133.8 lb

## 2021-09-14 DIAGNOSIS — M62838 Other muscle spasm: Secondary | ICD-10-CM

## 2021-09-14 DIAGNOSIS — Z8261 Family history of arthritis: Secondary | ICD-10-CM

## 2021-09-14 DIAGNOSIS — R768 Other specified abnormal immunological findings in serum: Secondary | ICD-10-CM

## 2021-09-14 DIAGNOSIS — M47812 Spondylosis without myelopathy or radiculopathy, cervical region: Secondary | ICD-10-CM

## 2021-09-14 DIAGNOSIS — M2241 Chondromalacia patellae, right knee: Secondary | ICD-10-CM

## 2021-09-14 DIAGNOSIS — E559 Vitamin D deficiency, unspecified: Secondary | ICD-10-CM

## 2021-09-14 DIAGNOSIS — M1812 Unilateral primary osteoarthritis of first carpometacarpal joint, left hand: Secondary | ICD-10-CM | POA: Diagnosis not present

## 2021-09-14 DIAGNOSIS — M2242 Chondromalacia patellae, left knee: Secondary | ICD-10-CM

## 2021-09-14 DIAGNOSIS — M797 Fibromyalgia: Secondary | ICD-10-CM

## 2021-09-14 DIAGNOSIS — M47813 Spondylosis without myelopathy or radiculopathy, cervicothoracic region: Secondary | ICD-10-CM

## 2021-09-14 DIAGNOSIS — I7300 Raynaud's syndrome without gangrene: Secondary | ICD-10-CM

## 2021-09-14 DIAGNOSIS — G4709 Other insomnia: Secondary | ICD-10-CM

## 2021-09-14 NOTE — Addendum Note (Signed)
Addended by: Ellen Henri on: 09/14/2021 09:15 AM   Modules accepted: Orders

## 2021-09-25 DIAGNOSIS — R293 Abnormal posture: Secondary | ICD-10-CM | POA: Diagnosis not present

## 2021-09-25 DIAGNOSIS — M7918 Myalgia, other site: Secondary | ICD-10-CM | POA: Diagnosis not present

## 2021-09-25 DIAGNOSIS — M542 Cervicalgia: Secondary | ICD-10-CM | POA: Diagnosis not present

## 2021-09-25 DIAGNOSIS — M546 Pain in thoracic spine: Secondary | ICD-10-CM | POA: Diagnosis not present

## 2021-09-27 DIAGNOSIS — Z1211 Encounter for screening for malignant neoplasm of colon: Secondary | ICD-10-CM | POA: Diagnosis not present

## 2021-09-27 DIAGNOSIS — K633 Ulcer of intestine: Secondary | ICD-10-CM | POA: Diagnosis not present

## 2021-09-27 DIAGNOSIS — K5000 Crohn's disease of small intestine without complications: Secondary | ICD-10-CM | POA: Diagnosis not present

## 2021-10-10 DIAGNOSIS — M546 Pain in thoracic spine: Secondary | ICD-10-CM | POA: Diagnosis not present

## 2021-10-10 DIAGNOSIS — M542 Cervicalgia: Secondary | ICD-10-CM | POA: Diagnosis not present

## 2021-10-10 DIAGNOSIS — M7918 Myalgia, other site: Secondary | ICD-10-CM | POA: Diagnosis not present

## 2021-10-10 DIAGNOSIS — R293 Abnormal posture: Secondary | ICD-10-CM | POA: Diagnosis not present

## 2021-10-17 DIAGNOSIS — M546 Pain in thoracic spine: Secondary | ICD-10-CM | POA: Diagnosis not present

## 2021-10-17 DIAGNOSIS — M7918 Myalgia, other site: Secondary | ICD-10-CM | POA: Diagnosis not present

## 2021-10-17 DIAGNOSIS — M542 Cervicalgia: Secondary | ICD-10-CM | POA: Diagnosis not present

## 2021-10-17 DIAGNOSIS — R293 Abnormal posture: Secondary | ICD-10-CM | POA: Diagnosis not present

## 2021-11-07 ENCOUNTER — Telehealth: Payer: Self-pay

## 2021-11-07 NOTE — Telephone Encounter (Signed)
No VM, if patient calls back, please advise patient to call PCP. ?

## 2021-11-07 NOTE — Telephone Encounter (Signed)
Patient called stating she is experiencing a lot of pain in her neck and back.  Patient states "she lost her husband last weekend" and is having a difficult time handling the pain.  Patient states she usually doesn't take any pain medication only herbal supplements, but feels she needs something stronger and requested a return call.   ?

## 2021-11-08 NOTE — Telephone Encounter (Signed)
Attempted to contact the patient. No answer and unable to leave a message, voicemail not set up.  ?

## 2021-11-10 NOTE — Telephone Encounter (Signed)
Spoke with patient and advised we do not prescribe pain medication. Patient advised to seek evaluation at urgent care or with PCP. Patient expressed understanding.  ?

## 2022-02-07 NOTE — Progress Notes (Unsigned)
Office Visit Note  Patient: Kimberly Horne             Date of Birth: Mar 15, 1971           MRN: [id number]             PCP: Myrlene Broker, MD Referring: Myrlene Broker, * Visit Date: 02/08/2022 Occupation: @GUAROCC @  Subjective:  Neck pain  History of Present Illness: Kimberly Horne is a 51 y.o. female with history of positive ANA and fibromyalgia.  Patient presents today with increased neck pain and stiffness.  She states that she was previously diagnosed with scoliosis in her neck.  She states [facility] but discontinued due to loss of insurance.  She has not noticed much improvement in her symptoms while going to PT.  She has been using topical agents and been trying to avoid lifting any heavy objects.  She denies any symptoms of radiculopathy at this time.  Most of her discomfort seems to be muscular in origin.  She is having trapezius muscle tension and tenderness on the right side.  She denies any other joint pain or joint swelling at this time.  She denies any other new concerns currently.    Activities of Daily Living:  Patient reports morning stiffness for 5 minutes.   Patient Reports nocturnal pain.  Difficulty dressing/grooming: Reports Difficulty climbing stairs: Denies Difficulty getting out of chair: Denies Difficulty using hands for taps, buttons, cutlery, and/or writing: Denies  Review of Systems  Constitutional:  Positive for fatigue.  HENT:  Negative for mouth sores, mouth dryness and nose dryness.   Eyes:  Negative for pain, visual disturbance and dryness.  Respiratory:  Negative for cough, hemoptysis, shortness of breath and difficulty breathing.   Cardiovascular:  Negative for chest pain, palpitations, hypertension and swelling in legs/feet.  Gastrointestinal:  Negative for blood in stool, constipation and diarrhea.  Endocrine: Negative for excessive thirst and increased  urination.  Genitourinary:  Negative for difficulty urinating and painful urination.  Musculoskeletal:  Positive for joint pain, joint pain, muscle weakness, morning stiffness and muscle tenderness. Negative for joint swelling, myalgias and myalgias.  Skin:  Negative for color change, pallor, rash, hair loss, nodules/bumps, skin tightness, ulcers and sensitivity to sunlight.  Allergic/Immunologic: Negative for susceptible to infections.  Neurological:  Negative for dizziness, numbness, headaches and weakness.  Hematological:  Negative for bruising/bleeding tendency and swollen glands.  Psychiatric/Behavioral:  Positive for sleep disturbance. Negative for depressed mood. The patient is not nervous/anxious.     PMFS History:  Patient Active Problem List   Diagnosis Date Noted   Ear pain 09/01/2021   [name] without gangrene 06/15/2021   Arthritis of carpometacarpal [facility]) joint of left thumb 06/15/2021   Fibromyalgia 06/15/2021   Pharyngitis 08/28/2020   Vitamin D deficiency 06/02/2020   LLQ pain 10/24/2017   Routine general medical examination at a health care facility 01/24/2016    Past Medical History:  Diagnosis Date   Osteoarthritis    Scoliosis     Family History  Problem Relation Age of Onset   Hyperlipidemia Mother    Hypertension Mother    Diabetes Father    Healthy Sister    Healthy Brother    Healthy Brother    Healthy Daughter    Healthy Daughter    Past Surgical History:  Procedure Laterality Date   CHOLECYSTECTOMY  2002   COCCYX REMOVAL  1997   HAND  SURGERY Right    cyst removed   Social History   Social History Narrative   Not on file   Immunization History  Administered Date(s) Administered   Moderna SARS-COV2 Booster Vaccination 06/17/2020   Moderna Sars-Covid-2 Vaccination 09/24/2019, 10/27/2019, 06/17/2020   PPD Test 12/11/2017   Tdap 02/24/2013     Objective: Vital Signs: BP 120/80 (BP Location: Right Arm, Patient Position:  Sitting, Cuff Size: Normal)   Pulse 73   Resp 15   Ht 5\' 2"  (1.575 m)   Wt 131 lb (59.4 kg)   BMI 23.96 kg/m    Physical Exam Vitals and nursing note reviewed.  Constitutional:      Appearance: She is well-developed.  HENT:     Head: Normocephalic and atraumatic.  Eyes:     Conjunctiva/sclera: Conjunctivae normal.  Cardiovascular:     Rate and Rhythm: Normal rate and regular rhythm.     Heart sounds: Normal heart sounds.  Pulmonary:     Effort: Pulmonary effort is normal.     Breath sounds: Normal breath sounds.  Abdominal:     General: Bowel sounds are normal.     Palpations: Abdomen is soft.  Musculoskeletal:     Cervical back: Normal range of motion.  Lymphadenopathy:     Cervical: No cervical adenopathy.  Skin:    General: Skin is warm and dry.     Capillary Refill: Capillary refill takes less than 2 seconds.  Neurological:     Mental Status: She is alert and oriented to person, place, and time.  Psychiatric:        Behavior: Behavior normal.      Musculoskeletal Exam: C-spine has slightly limited range of motion with lateral rotation especially to the right.  She has right trapezius muscle tension and tenderness.  Some midline spinal tenderness in the lumbar region.  No SI joint tenderness upon palpation.  Shoulder joints, elbow joints, wrist joints, MCPs, PIPs, DIPs have good range of motion with no synovitis.  CMC joint prominence noted bilaterally.  Hip joints have good range of motion with no groin pain.  Knee joints have good range of motion with no warmth or effusion.  Ankle joints have good range of motion with no tenderness or joint swelling.  CDAI Exam: CDAI Score: -- Patient Global: --; Provider Global: -- Swollen: --; Tender: -- Joint Exam 02/08/2022   No joint exam has been documented for this visit   There is currently no information documented on the homunculus. Go to the Rheumatology activity and complete the homunculus joint  exam.  Investigation: No additional findings.  Imaging: No results found.  Recent Labs: Lab Results  Component Value Date   WBC 7.1 06/02/2020   HGB 13.5 06/02/2020   PLT 299 06/02/2020   NA 135 06/02/2020   K 4.1 06/02/2020   CL 100 06/02/2020   CO2 27 06/02/2020   GLUCOSE 77 06/02/2020   BUN 10 06/02/2020   CREATININE 0.56 06/02/2020   BILITOT 0.6 06/02/2020   ALKPHOS 74 03/04/2019   AST 15 06/02/2020   ALT 16 06/02/2020   PROT 7.9 06/02/2020   ALBUMIN 4.0 03/04/2019   CALCIUM 9.2 06/02/2020   GFRAA 127 06/02/2020    Speciality Comments: No specialty comments available.  Procedures:  No procedures performed Allergies: Patient has no known allergies.   Assessment / Plan:     Visit Diagnoses: Fibromyalgia: Patient continues to experience intermittent myalgias and muscle tenderness due to fibromyalgia.  Overall her symptoms have been  tolerable.  She has been experiencing intermittent trapezius muscle tension and tenderness especially on the right side.  She experiences pain at night when lying on her right side at night.  She [facility] but had minimal improvement in her symptoms.  She has been using topical agents and stretching her neck on a daily basis.  I offered trigger point injections today but she declined.  An x-ray of the C-spine was obtained for further evaluation.  She will notify us if she would like a new order sent to [facility].  A prescription for tizanidine 4 mg at bedtime as needed for muscle spasms was sent to the pharmacy for the current flare.  Discussed that tizanidine is not recommended long-term.  Potential side effects were discussed today.  She was advised to avoid driving or operating machinery after taking tizanidine. Discussed the importance of regular exercise and good sleep hygiene.  She will follow-up in the office in 6 months or sooner if needed.  Trapezius  muscle spasm: She presents today with trapezius muscle tension and tenderness bilaterally.  Her symptoms have been most severe on the right side.  She is experiencing increased neck pain and stiffness.  A C-spine x-ray was obtained today for further evaluation.  She is right-hand dominant.  She has no symptoms of radiculopathy.  She has tenderness to palpation on examination today.  Different treatment options were discussed today in detail.  She declined trigger point injections.  Discussed the use of topical agents as well as a heating pad.  I also discussed that she may benefit from massage or restarting therapy [facility].  Other insomnia: She experiences interrupted sleep at night due to nocturnal pain in her neck.  Discussed the importance of good sleep hygiene.  A prescription for tizanidine 4 mg at bedtime as needed for muscle spasms was sent to the pharmacy today which may help her sleep at night during flares.  Positive ANA (antinuclear antibody): AVISE 12/13/2020: Index -1.6.  ANA 1: 320 NS, rest of panel negative.  She has no clinical features of systemic lupus at this time.  [name] without gangrene: Not currently active.  No digital ulcerations or signs of gangrene were noted.  Arthritis of carpometacarpal (CMC) joint of left thumb: She experiences intermittent discomfort in the left CMC joint.  No inflammation was noted on examination today.  Family history of rheumatoid arthritis: She has no clinical features of rheumatoid arthritis at this time.  No synovitis was noted.  Chondromalacia of both patellae: Good range of motion of both knee joints on examination today.  No warmth or effusion noted.  Neck pain - She presents today with increased neck pain and stiffness.  She has slightly limited range of motion with lateral rotation and flexion.  She has right trapezius muscle tension and tenderness.  No symptoms of radiculopathy at this time.  X-rays of the C-spine  were obtained today for further evaluation.  Offered referral to physical therapy but she will notify us when and if she would like [facility].  She was advised to notify us if she develops any new or worsening symptoms.  Plan: XR Cervical Spine 2 or 3 views  Spondylosis without myelopathy or radiculopathy, cervical region: No recent injury. Updated x-rays of the C-spine were obtained today.  Spondylosis of cervicothoracic spine: She continues to experience discomfort in her neck and upper back.  She  is having right trapezius muscle tension and tenderness.  She declined trigger point injections today.  She would like to hold off on restarting physical therapy at this time but will notify us if she has changed her mind.  Vitamin D deficiency: She is taking vitamin D 2000 units daily.     Orders: Orders Placed This Encounter  Procedures   XR Cervical Spine 2 or 3 views   Meds ordered this encounter  Medications   tiZANidine (ZANAFLEX) 4 MG tablet    Sig: Take 1 tablet (4 mg total) by mouth at bedtime.    Dispense:  14 tablet    Refill:  0      Follow-Up Instructions: Return in about 6 months (around 08/10/2022) for Fibromyalgia, +ANA.   Gearldine Bienenstock, PA-C  Note - This record has been created using Dragon software.  Chart creation errors have been sought, but may not always  have been located. Such creation errors do not reflect on  the standard of medical care.

## 2022-02-08 ENCOUNTER — Other Ambulatory Visit: Payer: Self-pay | Admitting: *Deleted

## 2022-02-08 ENCOUNTER — Ambulatory Visit: Payer: Commercial Managed Care - HMO | Admitting: Physician Assistant

## 2022-02-08 ENCOUNTER — Ambulatory Visit (INDEPENDENT_AMBULATORY_CARE_PROVIDER_SITE_OTHER): Payer: Commercial Managed Care - HMO

## 2022-02-08 ENCOUNTER — Encounter: Payer: Self-pay | Admitting: Physician Assistant

## 2022-02-08 VITALS — BP 120/80 | HR 73 | Resp 15 | Ht 62.0 in | Wt 131.0 lb

## 2022-02-08 DIAGNOSIS — M797 Fibromyalgia: Secondary | ICD-10-CM | POA: Diagnosis not present

## 2022-02-08 DIAGNOSIS — M2242 Chondromalacia patellae, left knee: Secondary | ICD-10-CM

## 2022-02-08 DIAGNOSIS — M1812 Unilateral primary osteoarthritis of first carpometacarpal joint, left hand: Secondary | ICD-10-CM

## 2022-02-08 DIAGNOSIS — M2241 Chondromalacia patellae, right knee: Secondary | ICD-10-CM

## 2022-02-08 DIAGNOSIS — M62838 Other muscle spasm: Secondary | ICD-10-CM | POA: Diagnosis not present

## 2022-02-08 DIAGNOSIS — M542 Cervicalgia: Secondary | ICD-10-CM | POA: Diagnosis not present

## 2022-02-08 DIAGNOSIS — R768 Other specified abnormal immunological findings in serum: Secondary | ICD-10-CM | POA: Diagnosis not present

## 2022-02-08 DIAGNOSIS — M47812 Spondylosis without myelopathy or radiculopathy, cervical region: Secondary | ICD-10-CM

## 2022-02-08 DIAGNOSIS — G4709 Other insomnia: Secondary | ICD-10-CM

## 2022-02-08 DIAGNOSIS — I7300 Raynaud's syndrome without gangrene: Secondary | ICD-10-CM

## 2022-02-08 DIAGNOSIS — M47813 Spondylosis without myelopathy or radiculopathy, cervicothoracic region: Secondary | ICD-10-CM

## 2022-02-08 DIAGNOSIS — Z8261 Family history of arthritis: Secondary | ICD-10-CM

## 2022-02-08 DIAGNOSIS — E559 Vitamin D deficiency, unspecified: Secondary | ICD-10-CM

## 2022-02-08 MED ORDER — TIZANIDINE HCL 4 MG PO TABS: 4.0000 mg | ORAL_TABLET | Freq: Every day | ORAL | 0 refills | Status: DC

## 2022-02-08 NOTE — Progress Notes (Signed)
X-rays of the C-spine are consistent with multilevel spondylosis and face joint arthropathy. Significant narrowing between C2-C3.   Please notify the patient.   A prescription of zanaflex was sent to the pharmacy.   Dr. Corliss Skains recommended proceeding with MRI of the C-spine if she is experiencing severe pain.

## 2022-02-20 ENCOUNTER — Other Ambulatory Visit: Payer: Self-pay | Admitting: Physician Assistant

## 2022-02-27 ENCOUNTER — Telehealth: Payer: Self-pay | Admitting: *Deleted

## 2022-02-27 DIAGNOSIS — M47813 Spondylosis without myelopathy or radiculopathy, cervicothoracic region: Secondary | ICD-10-CM

## 2022-02-27 DIAGNOSIS — M47812 Spondylosis without myelopathy or radiculopathy, cervical region: Secondary | ICD-10-CM

## 2022-02-27 DIAGNOSIS — M542 Cervicalgia: Secondary | ICD-10-CM

## 2022-02-27 NOTE — Telephone Encounter (Signed)
Patient advised the MRI was not approved through her insurance. Patient advised she will need to try PT or we can refer her to a spine specialist. Patient states she saw one in the past and will look for that information and call back with the name so we can send a new referral.

## 2022-02-28 NOTE — Telephone Encounter (Signed)
Patient called the office back stating she has seen Dr. Sharolyn Douglas and Cr. Malachy Chamber in the past. Patient is requesting referral to be placed to Dr. Precious Gilding. Referral placed.

## 2022-02-28 NOTE — Addendum Note (Signed)
Addended by: Henriette Combs on: 02/28/2022 10:58 AM   Modules accepted: Orders

## 2022-03-15 ENCOUNTER — Ambulatory Visit: Payer: BC Managed Care – PPO | Admitting: Rheumatology

## 2022-04-03 ENCOUNTER — Encounter: Payer: Self-pay | Admitting: Emergency Medicine

## 2022-04-03 ENCOUNTER — Ambulatory Visit (INDEPENDENT_AMBULATORY_CARE_PROVIDER_SITE_OTHER): Payer: Commercial Managed Care - HMO | Admitting: Emergency Medicine

## 2022-04-03 VITALS — BP 136/90 | HR 76 | Temp 98.7°F | Ht 62.0 in | Wt 128.8 lb

## 2022-04-03 DIAGNOSIS — J069 Acute upper respiratory infection, unspecified: Secondary | ICD-10-CM | POA: Diagnosis not present

## 2022-04-03 NOTE — Progress Notes (Signed)
Kimberly Horne 51 y.o.   Chief Complaint  Patient presents with   Sinus Problem    Pt c/o sinus congestion coughing itching throat by 6 days. Taking benadryl for symptoms.     HISTORY OF PRESENT ILLNESS: This is a 51 y.o. female complaining of flulike symptoms that started 7 days ago and is progressively getting better. Denies fever or chills. Recently returned from trip to [place]. No other complaints or medical concerns today.  Sinus Problem Associated symptoms include congestion, coughing and a sore throat. Pertinent negatives include no chills or headaches.     Prior to Admission medications   Medication Sig Start Date End Date Taking? Authorizing Provider  Cholecalciferol (VITAMIN D) 50 MCG (2000 UT) CAPS Take by mouth daily.   Yes [provider]  Ginger, Zingiber officinalis, (GINGER ROOT PO) Take by mouth.   Yes [provider]  Multiple Vitamin (MULTIVITAMIN PO) Take by mouth daily.   Yes [provider]  Omega-3 Fatty Acids (FISH OIL PO) Take by mouth.   Yes [provider]  tiZANidine (ZANAFLEX) 4 MG tablet Take 1 tablet (4 mg total) by mouth at bedtime. 02/08/22  Yes Gearldine Bienenstock, PA-C  TURMERIC PO Take by mouth.   Yes [provider]  amLODipine (NORVASC) 5 MG tablet Take 1 tablet (5 mg total) by mouth daily. 06/15/21   Pollyann Savoy, MD    No Known Allergies  Patient Active Problem List   Diagnosis Date Noted   [name] without gangrene 06/15/2021   Arthritis of carpometacarpal [facility]) joint of left thumb 06/15/2021   Fibromyalgia 06/15/2021   Vitamin D deficiency 06/02/2020    Past Medical History:  Diagnosis Date   Osteoarthritis    Scoliosis     Past Surgical History:  Procedure Laterality Date   CHOLECYSTECTOMY  2002   COCCYX REMOVAL  1997   HAND SURGERY Right    cyst removed    Social History   Socioeconomic History   Marital status: Married    Spouse name: Not on file    Number of children: Not on file   Years of education: Not on file   Highest education level: Not on file  Occupational History   Not on file  Tobacco Use   Smoking status: Never   Smokeless tobacco: Never  Vaping Use   Vaping Use: Never used  Substance and Sexual Activity   Alcohol use: Not Currently   Drug use: Never   Sexual activity: Not on file  Other Topics Concern   Not on file  Social History Narrative   Not on file   Social Determinants of Health   Financial Resource Strain: Not on file  Food Insecurity: Not on file  Transportation Needs: Not on file  Physical Activity: Not on file  Stress: Not on file  Social Connections: Not on file  Intimate Partner Violence: Not on file    Family History  Problem Relation Age of Onset   Hyperlipidemia Mother    Hypertension Mother    Diabetes Father    Healthy Sister    Healthy Brother    Healthy Brother    Healthy Daughter    Healthy Daughter      Review of Systems  Constitutional: Negative.  Negative for chills and fever.  HENT:  Positive for congestion and sore throat.   Respiratory:  Positive for cough.   Cardiovascular:  Negative for chest pain and palpitations.  Gastrointestinal:  Negative for abdominal pain, diarrhea,  nausea and vomiting.  Genitourinary: Negative.   Skin: Negative.  Negative for rash.  Neurological: Negative.  Negative for dizziness and headaches.  All other systems reviewed and are negative.  Today's Vitals   04/03/22 1415  BP: (!) 136/90  Pulse: 76  Temp: 98.7 F (37.1 C)  TempSrc: Oral  SpO2: 97%  Weight: 128 lb 12.8 oz (58.4 kg)  Height: 5\' 2"  (1.575 m)   Body mass index is 23.56 kg/m.   Physical Exam Vitals reviewed.  Constitutional:      Appearance: Normal appearance.  HENT:     Head: Normocephalic.     Right Ear: Tympanic membrane, ear canal and external ear normal.     Left Ear: Tympanic membrane, ear canal and external ear normal.     Mouth/Throat:     Mouth:  Mucous membranes are moist.     Pharynx: Oropharynx is clear. No oropharyngeal exudate or posterior oropharyngeal erythema.  Eyes:     Extraocular Movements: Extraocular movements intact.     Conjunctiva/sclera: Conjunctivae normal.     Pupils: Pupils are equal, round, and reactive to light.  Cardiovascular:     Rate and Rhythm: Normal rate and regular rhythm.     Pulses: Normal pulses.     Heart sounds: Normal heart sounds.  Pulmonary:     Effort: Pulmonary effort is normal.     Breath sounds: Normal breath sounds.  Musculoskeletal:     Cervical back: No tenderness.  Lymphadenopathy:     Cervical: No cervical adenopathy.  Skin:    General: Skin is warm and dry.     Capillary Refill: Capillary refill takes less than 2 seconds.  Neurological:     General: No focal deficit present.     Mental Status: She is alert and oriented to person, place, and time.  Psychiatric:        Mood and Affect: Mood normal.        Behavior: Behavior normal.      ASSESSMENT & PLAN: A total of 32 minutes was spent with the patient and counseling/coordination of care regarding preparing for this visit, review of available medical records, review of multiple chronic medical problems and their management, review of all medications, diagnosis of viral respiratory infection and treatment, prognosis, documentation, and need for follow-up.  Problem List Items Addressed This Visit       Respiratory   Upper respiratory tract infection - Primary    Clinically stable.  No red flag signs or symptoms. Running its course and slowly getting better. Continue treating symptoms and supportive care. Advised to rest and stay well-hydrated. Tylenol as needed. Advised to contact the office if no better or worse during the next several days.      Patient Instructions  Infeccin respiratoria viral Viral Respiratory Infection Una infeccin respiratoria viral es una enfermedad que afecta las partes del cuerpo que  utilizamos para . Estas incluyen los pulmones, la nariz y Industrial/product designer. Es causada por un germen llamado virus. Algunos ejemplos de este tipo de infeccin son los siguientes: Un resfro. La gripe (influenza). Una infeccin por el virus respiratorio sincicial (VRS). Cules son las causas? La causa de esta afeccin es un virus. Se transmite de Administrator persona a otra. Puede contraer el virus si: Inhala gotitas de una persona que est enferma. Tiene contacto con personas que estn enfermas. Toca mucosidad u otro lquido de una persona que est enferma. Cules son los signos o sntomas? Los sntomas de esta afeccin incluyen: [place]  o congestin nasal. Dolor de garganta. Tos. Falta de aire. Dificultad para respirar. Lquido verde o [employer]. Otros sntomas pueden incluir: Fiebre. Sudoracin o escalofros. Cansancio (fatiga). Dolores musculares. Dolor de [place]. Cmo se trata? El tratamiento de esta afeccin puede incluir: Medicamentos para tratar los virus. Medicamentos que facilitan la respiracin. Medicamentos que se pulverizan dentro de la Clinical cytogeneticist. Paracetamol o antiinflamatorios no esteroideos (AINE), como ibuprofeno, para tratar [employer]. Siga estas instrucciones en su casa: Control del dolor y la congestin Use los medicamentos de venta libre y los recetados solamente como se lo haya indicado el mdico. Si le duele la garganta, haga grgaras de agua con sal. Haga esto entre 3 o 4 veces por da, segn sea necesario. Para preparar agua con sal, disuelva de  a 1 cucharadita (de 3 a 6 g) de sal en 1 taza (237 ml) de agua tibia. Asegrese de que se disuelva toda la sal. Use gotas para la nariz hechas con agua salada. Estas ayudan con la secrecin (congestin). Tambin ayudan a Chartered loss adjuster piel alrededor de Architectural technologist. Tome 2 cucharaditas (10 ml) de miel a la hora de acostarse para disminuir la tos por la noche. No d miel a nios menores de 1 ao. Beba suficiente  lquido para Radio producer pis (la orina) de color amarillo plido. Instrucciones generales  Descanse todo lo que pueda. No beba alcohol. No fume ni consuma ningn producto que contenga nicotina o tabaco. Si necesita ayuda para dejar de fumar, consulte al mdico. Concurra a todas las visitas de seguimiento. Cmo se evita?     Colquese la vacuna antigripal todos los [place]. Pregntele al mdico cundo debe aplicarse la vacuna contra la gripe. No permita que otras personas contraigan sus grmenes. Si est enfermo: Lvese las manos frecuentemente con agua y [place]. Lvese las manos despus de toser o Engineering geologist. Lvese las manos durante al menos 20 segundos. Use un desinfectante para manos si no dispone de [place] y [place]. Cbrase la boca al toser. Cbrase la nariz y la boca cuando estornude. No comparta vasos ni utensilios para comer. Limpie los objetos usados comnmente con frecuencia. Limpie las superficies que se tocan comnmente. Lanny Hurst en su casa y no concurra al Aleen Campi ni a la escuela. Evite el contacto con personas que estn enfermas durante la temporada de resfro y gripe. Esta es en otoo e invierno. Solicite ayuda si: Los sntomas duran [address] o ms. Los sntomas empeoran con [employer]. Repentinamente, siente un dolor muy intenso en el rostro o la frente. Se hinchan mucho algunas partes de la mandbula o del cuello. Le falta el aire. Solicite ayuda de inmediato si: Electronics engineer u opresin en el pecho. Tiene dificultad para respirar. Se siente mareado o como si fuera a desmayarse. Sigue vomitando y [place]. Se siente confundido. Estos sntomas pueden Customer service manager. Solicite ayuda de inmediato. Comunquese con el servicio de emergencias de su localidad (911 en los [place]). No espere a ver si los sntomas desaparecen. No conduzca por sus propios medios [employer] [facility]. Resumen Una infeccin respiratoria viral es una enfermedad que afecta las partes del cuerpo  que utilizamos para Industrial/product designer. Entre los ejemplos de esta enfermedad, se incluyen el resfro, la gripe y la infeccin por el virus respiratorio sincicial (VRS). La infeccin puede causar secrecin nasal, tos, dolor de garganta y [place]. Siga las indicaciones del mdico acerca de tomar medicamentos, beber gran cantidad de lquido, lavarse las manos, Lawyer en casa y Recruitment consultant con  personas enfermas. Esta informacin no tiene Theme park manager el consejo del mdico. Asegrese de hacerle al mdico cualquier pregunta que tenga. Document Revised: 12/02/2020 Document Reviewed: 12/02/2020 Elsevier Patient Education  2023 Elsevier Inc.     Edwina Barth, MD [facility]

## 2022-04-03 NOTE — Assessment & Plan Note (Signed)
Clinically stable.  No red flag signs or symptoms. Running its course and slowly getting better. Continue treating symptoms and supportive care. Advised to rest and stay well-hydrated. Tylenol as needed. Advised to contact the office if no better or worse during the next several days.

## 2022-04-03 NOTE — Patient Instructions (Signed)
  Infeccin respiratoria viral Viral Respiratory Infection Una infeccin respiratoria viral es una enfermedad que afecta las partes del cuerpo que utilizamos para respirar. Estas incluyen los pulmones, la nariz y la garganta. Es causada por un germen llamado virus. Algunos ejemplos de este tipo de infeccin son los siguientes: Un resfro. La gripe (influenza). Una infeccin por el virus respiratorio sincicial (VRS). Cules son las causas? La causa de esta afeccin es un virus. Se transmite de una persona a otra. Puede contraer el virus si: Inhala gotitas de una persona que est enferma. Tiene contacto con personas que estn enfermas. Toca mucosidad u otro lquido de una persona que est enferma. Cules son los signos o sntomas? Los sntomas de esta afeccin incluyen: Secrecin o congestin nasal. Dolor de garganta. Tos. Falta de aire. Dificultad para respirar. Lquido verde o amarillo en la nariz. Otros sntomas pueden incluir: Fiebre. Sudoracin o escalofros. Cansancio (fatiga). Dolores musculares. Dolor de cabeza. Cmo se trata? El tratamiento de esta afeccin puede incluir: Medicamentos para tratar los virus. Medicamentos que facilitan la respiracin. Medicamentos que se pulverizan dentro de la nariz. Paracetamol o antiinflamatorios no esteroideos (AINE), como ibuprofeno, para tratar la fiebre. Siga estas instrucciones en su casa: Control del dolor y la congestin Use los medicamentos de venta libre y los recetados solamente como se lo haya indicado el mdico. Si le duele la garganta, haga grgaras de agua con sal. Haga esto entre 3 o 4 veces por da, segn sea necesario. Para preparar agua con sal, disuelva de  a 1 cucharadita (de 3 a 6 g) de sal en 1 taza (237 ml) de agua tibia. Asegrese de que se disuelva toda la sal. Use gotas para la nariz hechas con agua salada. Estas ayudan con la secrecin (congestin). Tambin ayudan a suavizar la piel alrededor de la  nariz. Tome 2 cucharaditas (10 ml) de miel a la hora de acostarse para disminuir la tos por la noche. No d miel a nios menores de 1 ao. Beba suficiente lquido para mantener el pis (la orina) de color amarillo plido. Instrucciones generales  Descanse todo lo que pueda. No beba alcohol. No fume ni consuma ningn producto que contenga nicotina o tabaco. Si necesita ayuda para dejar de fumar, consulte al mdico. Concurra a todas las visitas de seguimiento. Cmo se evita?     Colquese la vacuna antigripal todos los aos. Pregntele al mdico cundo debe aplicarse la vacuna contra la gripe. No permita que otras personas contraigan sus grmenes. Si est enfermo: Lvese las manos frecuentemente con agua y jabn. Lvese las manos despus de toser o estornudar. Lvese las manos durante al menos 20 segundos. Use un desinfectante para manos si no dispone de agua y jabn. Cbrase la boca al toser. Cbrase la nariz y la boca cuando estornude. No comparta vasos ni utensilios para comer. Limpie los objetos usados comnmente con frecuencia. Limpie las superficies que se tocan comnmente. Qudese en su casa y no concurra al trabajo ni a la escuela. Evite el contacto con personas que estn enfermas durante la temporada de resfro y gripe. Esta es en otoo e invierno. Solicite ayuda si: Los sntomas duran 10 das o ms. Los sntomas empeoran con el tiempo. Repentinamente, siente un dolor muy intenso en el rostro o la frente. Se hinchan mucho algunas partes de la mandbula o del cuello. Le falta el aire. Solicite ayuda de inmediato si: Siente dolor u opresin en el pecho. Tiene dificultad para respirar. Se siente mareado o como si fuera   a desmayarse. Sigue vomitando y empeora. Se siente confundido. Estos sntomas pueden indicar una emergencia. Solicite ayuda de inmediato. Comunquese con el servicio de emergencias de su localidad (911 en los [place]). No espere a ver si los sntomas  desaparecen. No conduzca por sus propios medios hasta el hospital. Resumen Una infeccin respiratoria viral es una enfermedad que afecta las partes del cuerpo que utilizamos para respirar. Entre los ejemplos de esta enfermedad, se incluyen el resfro, la gripe y la infeccin por el virus respiratorio sincicial (VRS). La infeccin puede causar secrecin nasal, tos, dolor de garganta y fiebre. Siga las indicaciones del mdico acerca de tomar medicamentos, beber gran cantidad de lquido, lavarse las manos, descansar en casa y evitar el contacto con personas enfermas. Esta informacin no tiene como fin reemplazar el consejo del mdico. Asegrese de hacerle al mdico cualquier pregunta que tenga. Document Revised: 12/02/2020 Document Reviewed: 12/02/2020 Elsevier Patient Education  2023 Elsevier Inc.  

## 2022-04-09 ENCOUNTER — Telehealth: Payer: Commercial Managed Care - HMO | Admitting: Internal Medicine

## 2022-04-10 ENCOUNTER — Encounter: Payer: Self-pay | Admitting: Emergency Medicine

## 2022-04-10 ENCOUNTER — Telehealth (INDEPENDENT_AMBULATORY_CARE_PROVIDER_SITE_OTHER): Payer: Commercial Managed Care - HMO | Admitting: Emergency Medicine

## 2022-04-10 DIAGNOSIS — U071 COVID-19: Secondary | ICD-10-CM

## 2022-04-10 MED ORDER — NIRMATRELVIR/RITONAVIR (PAXLOVID)TABLET: 3.0000 | ORAL_TABLET | Freq: Two times a day (BID) | ORAL | 0 refills | Status: AC

## 2022-04-10 NOTE — Progress Notes (Signed)
Telemedicine Encounter- SOAP NOTE Established Patient MyChart video conference today Patient: Home  Provider: Office   Patient present only  This video encounter was conducted with the patient's (or proxy's) verbal consent via audio telecommunications: yes/no: Yes Patient was instructed to have this encounter in a suitably private space; and to only have persons present to whom they give permission to participate. In addition, patient identity was confirmed by use of name plus two identifiers (DOB and address).  I discussed the limitations, risks, security and privacy concerns of performing an evaluation and management service by telephone and the availability of in person appointments. I also discussed with the patient that there may be a patient responsible charge related to this service. The patient expressed understanding and agreed to proceed.  I spent a total of TIME; 0 MIN TO 60 MIN: 30 minutes talking with the patient or their proxy.  Chief complaint: Flulike symptoms  Subjective   Kimberly Horne is a 51 y.o. female established patient. Telephone visit today complaining of flulike symptoms for about a week. Seen by me last week with flulike symptoms.  Tested negative for COVID. Symptoms however got worse and tested positive for COVID last Sunday, 2 days ago. Mostly complaining of headache, runny nose, sore throat and generalized achiness. No other complaints or medical concerns today.  HPI   Patient Active Problem List   Diagnosis Date Noted   Upper respiratory tract infection 04/03/2022   [name] without gangrene 06/15/2021   Arthritis of carpometacarpal [facility]) joint of left thumb 06/15/2021   Fibromyalgia 06/15/2021   Vitamin D deficiency 06/02/2020    Past Medical History:  Diagnosis Date   Osteoarthritis    Scoliosis     Current Outpatient Medications  Medication Sig Dispense Refill   nirmatrelvir/ritonavir EUA (PAXLOVID) 20 x 150 MG & 10  x 100MG  TABS Take 3 tablets by mouth 2 (two) times daily for 5 days. (Take nirmatrelvir 150 mg two tablets twice daily for 5 days and ritonavir 100 mg one tablet twice daily for 5 days) Patient GFR is 109 30 tablet 0   amLODipine (NORVASC) 5 MG tablet Take 1 tablet (5 mg total) by mouth daily. 30 tablet 2   Cholecalciferol (VITAMIN D) 50 MCG (2000 UT) CAPS Take by mouth daily.     Ginger, Zingiber officinalis, (GINGER ROOT PO) Take by mouth.     Multiple Vitamin (MULTIVITAMIN PO) Take by mouth daily.     Omega-3 Fatty Acids (FISH OIL PO) Take by mouth.     tiZANidine (ZANAFLEX) 4 MG tablet Take 1 tablet (4 mg total) by mouth at bedtime. 14 tablet 0   TURMERIC PO Take by mouth.     No current facility-administered medications for this visit.    No Known Allergies  Social History   Socioeconomic History   Marital status: Married    Spouse name: Not on file   Number of children: Not on file   Years of education: Not on file   Highest education level: Not on file  Occupational History   Not on file  Tobacco Use   Smoking status: Never   Smokeless tobacco: Never  Vaping Use   Vaping Use: Never used  Substance and Sexual Activity   Alcohol use: Not Currently   Drug use: Never   Sexual activity: Not on file  Other Topics Concern   Not on file  Social History Narrative   Not on file   Social Determinants of Health  Financial Resource Strain: Not on file  Food Insecurity: Not on file  Transportation Needs: Not on file  Physical Activity: Not on file  Stress: Not on file  Social Connections: Not on file  Intimate Partner Violence: Not on file    Review of Systems  Constitutional:  Positive for chills and malaise/fatigue. Negative for fever.  HENT:  Positive for congestion and sore throat.   Respiratory:  Positive for cough. Negative for shortness of breath.   Cardiovascular:  Negative for chest pain and palpitations.  Gastrointestinal:  Negative for abdominal pain,  diarrhea, nausea and vomiting.  Genitourinary: Negative.   Musculoskeletal:  Positive for myalgias.  Skin: Negative.  Negative for rash.  Neurological:  Positive for headaches. Negative for dizziness.    Objective  Alert and oriented x3 in no apparent respiratory distress Vitals as reported by the patient: There were no vitals filed for this visit.  Diagnoses and all orders for this visit:  COVID-19 virus infection -     nirmatrelvir/ritonavir EUA (PAXLOVID) 20 x 150 MG & 10 x 100MG  TABS; Take 3 tablets by mouth 2 (two) times daily for 5 days. (Take nirmatrelvir 150 mg two tablets twice daily for 5 days and ritonavir 100 mg one tablet twice daily for 5 days) Patient GFR is 109  Clinically stable.  No red flag signs or symptoms.  No complications. May benefit from starting Paxlovid twice a day for 5 days. Advised to rest and stay well-hydrated. ED precautions given. COVID advice given. Advised to contact the office if no better or worse during the next several days.   I discussed the assessment and treatment plan with the patient. The patient was provided an opportunity to ask questions and all were answered. The patient agreed with the plan and demonstrated an understanding of the instructions.   The patient was advised to call back or seek an in-person evaluation if the symptoms worsen or if the condition fails to improve as anticipated.  I provided 30 minutes of non-face-to-face time during this encounter including preparing for this visit, review of most recent office visit note, review of all medications, review of available medical records, and documentation.  , MD  Primary Care at [facility]

## 2022-04-18 DIAGNOSIS — Z01419 Encounter for gynecological examination (general) (routine) without abnormal findings: Secondary | ICD-10-CM | POA: Diagnosis not present

## 2022-04-18 DIAGNOSIS — Z1389 Encounter for screening for other disorder: Secondary | ICD-10-CM | POA: Diagnosis not present

## 2022-04-26 DIAGNOSIS — R8290 Unspecified abnormal findings in urine: Secondary | ICD-10-CM | POA: Diagnosis not present

## 2022-06-28 DIAGNOSIS — M4722 Other spondylosis with radiculopathy, cervical region: Secondary | ICD-10-CM | POA: Diagnosis not present

## 2022-06-28 DIAGNOSIS — M7918 Myalgia, other site: Secondary | ICD-10-CM | POA: Diagnosis not present

## 2022-06-28 DIAGNOSIS — M25512 Pain in left shoulder: Secondary | ICD-10-CM | POA: Diagnosis not present

## 2022-08-14 NOTE — Progress Notes (Signed)
Office Visit Note  Patient: Kimberly Horne             Date of Birth: Feb 17, 1971           MRN: [id number]             PCP: Hoyt Koch, MD Referring: Hoyt Koch, * Visit Date: 08/21/2022 Occupation: @GUAROCC @  Subjective:  Left foot pain  History of Present Illness: Kimberly Horne is a 52 y.o. female with history of fibromyalgia, osteoarthritis and degenerative disc disease.  She states she is working only 1 job instead of 2 jobs now which is easier on her body.  She has experienced less discomfort.  She also had cervical spine injection [facility] which relieved her neck pain.  She has not been experiencing much discomfort in the trapezius region or thoracic or lower back.  She continues to have mild Raynauds symptoms which are manageable with warm clothing.  She states she twisted her left foot about 3 months ago she still has some discomfort in her left foot.  Left CMC joint is doing better.  She denies any history of oral ulcers, nasal ulcers, malar rash, photosensitivity or lymphadenopathy.  There is no history of inflammatory arthritis.    Activities of Daily Living:  Patient reports morning stiffness for a few minutes.   Patient Reports nocturnal pain.  Difficulty dressing/grooming: Denies Difficulty climbing stairs: Denies Difficulty getting out of chair: Denies Difficulty using hands for taps, buttons, cutlery, and/or writing: Denies  Review of Systems  Constitutional:  Negative for fatigue.  HENT:  Negative for mouth sores and mouth dryness.   Eyes:  Negative for dryness.  Respiratory:  Negative for shortness of breath.   Cardiovascular:  Negative for chest pain and palpitations.  Gastrointestinal:  Negative for blood in stool, constipation and diarrhea.  Endocrine: Negative for increased urination.  Genitourinary:  Negative for involuntary urination.  Musculoskeletal:  Positive for joint pain, joint pain and  morning stiffness. Negative for gait problem, joint swelling, myalgias, muscle weakness, muscle tenderness and myalgias.  Skin:  Negative for color change, rash, hair loss and sensitivity to sunlight.  Allergic/Immunologic: Negative for susceptible to infections.  Neurological:  Negative for dizziness and headaches.  Hematological:  Negative for swollen glands.  Psychiatric/Behavioral:  Positive for depressed mood. Negative for sleep disturbance. The patient is not nervous/anxious.     PMFS History:  Patient Active Problem List   Diagnosis Date Noted   Upper respiratory tract infection 04/03/2022   [name] without gangrene 06/15/2021   Arthritis of carpometacarpal [facility]) joint of left thumb 06/15/2021   Fibromyalgia 06/15/2021   Vitamin D deficiency 06/02/2020    Past Medical History:  Diagnosis Date   Osteoarthritis    Scoliosis     Family History  Problem Relation Age of Onset   Hyperlipidemia Mother    Hypertension Mother    Dementia Mother    Diabetes Father    Alzheimer's disease Father    Healthy Sister    Healthy Brother    Healthy Brother    Healthy Daughter    Healthy Daughter    Past Surgical History:  Procedure Laterality Date   CHOLECYSTECTOMY  2002   COCCYX REMOVAL  1997   HAND SURGERY Right    cyst removed   Social History   Social History Narrative   Not on file   Immunization History  Administered Date(s) Administered   Moderna SARS-COV2 Booster Vaccination 06/17/2020  Moderna Sars-Covid-2 Vaccination 09/24/2019, 10/27/2019, 06/17/2020   PPD Test 12/11/2017   Tdap 02/24/2013     Objective: Vital Signs: BP 134/86 (BP Location: Left Arm, Patient Position: Sitting, Cuff Size: Normal)   Pulse 81   Resp 15   Ht 5\' 2"  (1.575 m)   Wt 130 lb 3.2 oz (59.1 kg)   BMI 23.81 kg/m    Physical Exam Vitals and nursing note reviewed.  Constitutional:      Appearance: She is well-developed.  HENT:     Head: Normocephalic and atraumatic.   Eyes:     Conjunctiva/sclera: Conjunctivae normal.  Cardiovascular:     Rate and Rhythm: Normal rate and regular rhythm.     Heart sounds: Normal heart sounds.  Pulmonary:     Effort: Pulmonary effort is normal.     Breath sounds: Normal breath sounds.  Abdominal:     General: Bowel sounds are normal.     Palpations: Abdomen is soft.  Musculoskeletal:     Cervical back: Normal range of motion.  Lymphadenopathy:     Cervical: No cervical adenopathy.  Skin:    General: Skin is warm and dry.     Capillary Refill: Capillary refill takes less than 2 seconds.  Neurological:     Mental Status: She is alert and oriented to person, place, and time.  Psychiatric:        Behavior: Behavior normal.      Musculoskeletal Exam: Cervical spine was in good range of motion.  Thoracic and lumbar spine were in good range of motion.  Shoulder joints, elbow joints, wrist joints, MCPs PIPs and DIPs been good range of motion with no synovitis.  Hip joints and knee joints were in good range of motion.  She had tenderness over the lateral aspect of her left midfoot.  No warmth swelling or redness was noted.  CDAI Exam: CDAI Score: -- Patient Global: --; Provider Global: -- Swollen: --; Tender: -- Joint Exam 08/21/2022   No joint exam has been documented for this visit   There is currently no information documented on the homunculus. Go to the Rheumatology activity and complete the homunculus joint exam.  Investigation: No additional findings.  Imaging: XR Foot Complete Left  Result Date: 08/21/2022 No MCP PIP or DIP narrowing was noted.  No intertarsal or tibiotalar joint space narrowing was noted.  Posterior inferior calcaneal spur was noted.  Possible chip fracture was noted of the cuboid. Impression: X-rays showed possible cuboid chip fracture.   Recent Labs: Lab Results  Component Value Date   WBC 7.1 06/02/2020   HGB 13.5 06/02/2020   PLT 299 06/02/2020   NA 135 06/02/2020   K 4.1  06/02/2020   CL 100 06/02/2020   CO2 27 06/02/2020   GLUCOSE 77 06/02/2020   BUN 10 06/02/2020   CREATININE 0.56 06/02/2020   BILITOT 0.6 06/02/2020   ALKPHOS 74 03/04/2019   AST 15 06/02/2020   ALT 16 06/02/2020   PROT 7.9 06/02/2020   ALBUMIN 4.0 03/04/2019   CALCIUM 9.2 06/02/2020   GFRAA 127 06/02/2020    Speciality Comments: No specialty comments available.  Procedures:  No procedures performed Allergies: Patient has no known allergies.   Assessment / Plan:     Visit Diagnoses: Pain in left foot-she twisted her left foot about 3 months ago.  She has been externals experiencing pain and discomfort when she walks.  She had tenderness over the lateral aspect of the left midfoot.  No redness or  swelling was noted.  Obtain x-rays of her left foot today.  X-rays showed possible cuboid chip fracture.  X-ray findings were discussed with the patient.  Patient is uncertain how long it has been since she had the injury.  I will refer her to podiatrist.  Proper fitting tennis shoes were advised.  Chondromalacia of both patellae-she has off-and-on discomfort in her knee joints.  Lower extremity muscle strengthening exercises were discussed.  Arthritis of carpometacarpal (CMC) joint of left thumb-she has noticed improvement in her thumb since she is not working full-time job.  She is only working part-time at discharge.  DDD (degenerative disc disease), cervical-she was spacing increased neck pain and discomfort which improved after the cortisone injection done at the spinal scoliosis center.  DDD (degenerative disc disease), thoracic-she is currently not having any thoracic discomfort.  Positive ANA (antinuclear antibody) - AVISE 12/13/2020: Index -1.6.  ANA 1: 320 NS, indeterminate dsDNA rest of panel negative.  She has no clinical features of systemic lupus at this time. -She denies any history of oral ulcers, nasal ulcers, malar rash, sicca symptoms, inflammatory arthritis or  lymphadenopathy.  She continues to have some [name] which is controlled with warm clothing.  Plan: Protein / creatinine ratio, urine, CBC with Differential/Platelet, COMPLETE METABOLIC PANEL WITH GFR, Anti-DNA antibody, double-stranded, C3 and C4, Sedimentation rate  [name] without gangrene-warm clothing and keeping core temperature warm was discussed.  Other insomnia-good sleep hygiene was close.  Fibromyalgia-her fibromyalgia symptoms have improved.  Vitamin D deficiency -she has history of vitamin D deficiency.  She has been taking vitamin D.  I will check vitamin D level today.  Plan: VITAMIN D 25 Hydroxy (Vit-D Deficiency, Fractures)  Family history of rheumatoid arthritis  Orders: Orders Placed This Encounter  Procedures   XR Foot Complete Left   Protein / creatinine ratio, urine   CBC with Differential/Platelet   COMPLETE METABOLIC PANEL WITH GFR   Anti-DNA antibody, double-stranded   C3 and C4   Sedimentation rate   VITAMIN D 25 Hydroxy (Vit-D Deficiency, Fractures)   Ambulatory referral to Podiatry   No orders of the defined types were placed in this encounter.    Follow-Up Instructions: Return in about 6 months (around 02/19/2023) for Osteoarthritis.   Pollyann Savoy, MD  Note - This record has been created using Animal nutritionist.  Chart creation errors have been sought, but may not always  have been located. Such creation errors do not reflect on  the standard of medical care.

## 2022-08-21 ENCOUNTER — Encounter: Payer: Self-pay | Admitting: Rheumatology

## 2022-08-21 ENCOUNTER — Ambulatory Visit: Payer: BC Managed Care – PPO | Attending: Rheumatology | Admitting: Rheumatology

## 2022-08-21 ENCOUNTER — Ambulatory Visit (INDEPENDENT_AMBULATORY_CARE_PROVIDER_SITE_OTHER): Payer: BC Managed Care – PPO

## 2022-08-21 VITALS — BP 134/86 | HR 81 | Resp 15 | Ht 62.0 in | Wt 130.2 lb

## 2022-08-21 DIAGNOSIS — R768 Other specified abnormal immunological findings in serum: Secondary | ICD-10-CM | POA: Diagnosis not present

## 2022-08-21 DIAGNOSIS — M47812 Spondylosis without myelopathy or radiculopathy, cervical region: Secondary | ICD-10-CM

## 2022-08-21 DIAGNOSIS — M47813 Spondylosis without myelopathy or radiculopathy, cervicothoracic region: Secondary | ICD-10-CM

## 2022-08-21 DIAGNOSIS — M2242 Chondromalacia patellae, left knee: Secondary | ICD-10-CM

## 2022-08-21 DIAGNOSIS — M79672 Pain in left foot: Secondary | ICD-10-CM

## 2022-08-21 DIAGNOSIS — M2241 Chondromalacia patellae, right knee: Secondary | ICD-10-CM | POA: Diagnosis not present

## 2022-08-21 DIAGNOSIS — M5030 Other cervical disc degeneration, unspecified cervical region: Secondary | ICD-10-CM | POA: Diagnosis not present

## 2022-08-21 DIAGNOSIS — M5134 Other intervertebral disc degeneration, thoracic region: Secondary | ICD-10-CM

## 2022-08-21 DIAGNOSIS — G4709 Other insomnia: Secondary | ICD-10-CM

## 2022-08-21 DIAGNOSIS — M542 Cervicalgia: Secondary | ICD-10-CM

## 2022-08-21 DIAGNOSIS — M62838 Other muscle spasm: Secondary | ICD-10-CM

## 2022-08-21 DIAGNOSIS — M1812 Unilateral primary osteoarthritis of first carpometacarpal joint, left hand: Secondary | ICD-10-CM

## 2022-08-21 DIAGNOSIS — E559 Vitamin D deficiency, unspecified: Secondary | ICD-10-CM

## 2022-08-21 DIAGNOSIS — M797 Fibromyalgia: Secondary | ICD-10-CM

## 2022-08-21 DIAGNOSIS — Z8261 Family history of arthritis: Secondary | ICD-10-CM

## 2022-08-21 DIAGNOSIS — I7300 Raynaud's syndrome without gangrene: Secondary | ICD-10-CM

## 2022-08-22 LAB — COMPLETE METABOLIC PANEL WITH GFR
AG Ratio: 1.3 (calc) (ref 1.0–2.5)
ALT: 17 U/L (ref 6–29)
AST: 16 U/L (ref 10–35)
Albumin: 4.1 g/dL (ref 3.6–5.1)
Alkaline phosphatase (APISO): 79 U/L (ref 37–153)
BUN: 13 mg/dL (ref 7–25)
CO2: 28 mmol/L (ref 20–32)
Calcium: 9.3 mg/dL (ref 8.6–10.4)
Chloride: 105 mmol/L (ref 98–110)
Creat: 0.55 mg/dL (ref 0.50–1.03)
Globulin: 3.2 g/dL (calc) (ref 1.9–3.7)
Glucose, Bld: 74 mg/dL (ref 65–99)
Potassium: 4.5 mmol/L (ref 3.5–5.3)
Sodium: 141 mmol/L (ref 135–146)
Total Bilirubin: 0.4 mg/dL (ref 0.2–1.2)
Total Protein: 7.3 g/dL (ref 6.1–8.1)
eGFR: 111 mL/min/{1.73_m2} (ref >=60)

## 2022-08-22 LAB — CBC WITH DIFFERENTIAL/PLATELET
Absolute Monocytes: 400 cells/uL (ref 200–950)
Basophils Absolute: 41 cells/uL (ref 0–200)
Basophils Relative: 0.6 %
Eosinophils Absolute: 179 cells/uL (ref 15–500)
Eosinophils Relative: 2.6 %
HCT: 39.2 % (ref 35.0–45.0)
Hemoglobin: 12.7 g/dL (ref 11.7–15.5)
Lymphs Abs: 1918 cells/uL (ref 850–3900)
MCH: 27.1 pg (ref 27.0–33.0)
MCHC: 32.4 g/dL (ref 32.0–36.0)
MCV: 83.8 fL (ref 80.0–100.0)
MPV: 9.6 fL (ref 7.5–12.5)
Monocytes Relative: 5.8 %
Neutro Abs: 4361 cells/uL (ref 1500–7800)
Neutrophils Relative %: 63.2 %
Platelets: 309 10*3/uL (ref 140–400)
RBC: 4.68 10*6/uL (ref 3.80–5.10)
RDW: 14.7 % (ref 11.0–15.0)
Total Lymphocyte: 27.8 %
WBC: 6.9 10*3/uL (ref 3.8–10.8)

## 2022-08-22 LAB — PROTEIN / CREATININE RATIO, URINE
Creatinine, Urine: 129 mg/dL (ref 20–275)
Protein/Creat Ratio: 70 mg/g creat (ref 24–184)
Protein/Creatinine Ratio: 0.07 mg/mg creat (ref 0.024–0.184)
Total Protein, Urine: 9 mg/dL (ref 5–24)

## 2022-08-22 LAB — C3 AND C4
C3 Complement: 154 mg/dL (ref 83–193)
C4 Complement: 34 mg/dL (ref 15–57)

## 2022-08-22 LAB — ANTI-DNA ANTIBODY, DOUBLE-STRANDED: ds DNA Ab: 3 IU/mL

## 2022-08-22 LAB — VITAMIN D 25 HYDROXY (VIT D DEFICIENCY, FRACTURES): Vit D, 25-Hydroxy: 35 ng/mL (ref 30–100)

## 2022-08-22 LAB — SEDIMENTATION RATE: Sed Rate: 33 mm/h — ABNORMAL HIGH (ref 0–30)

## 2022-08-23 NOTE — Progress Notes (Signed)
Sed rate is mildly elevated and stable.  Urine protein creatinine ratio is normal CBC is normal, CMP is normal, double-stranded DNA normal, complements negative, vitamin D 35 in the desirable range.  Labs do not medicate an autoimmune disease flare.

## 2022-11-01 ENCOUNTER — Other Ambulatory Visit (INDEPENDENT_AMBULATORY_CARE_PROVIDER_SITE_OTHER): Payer: BC Managed Care – PPO

## 2022-11-01 ENCOUNTER — Encounter: Payer: Self-pay | Admitting: Physician Assistant

## 2022-11-01 ENCOUNTER — Ambulatory Visit (INDEPENDENT_AMBULATORY_CARE_PROVIDER_SITE_OTHER): Payer: BC Managed Care – PPO | Admitting: Physician Assistant

## 2022-11-01 DIAGNOSIS — M25511 Pain in right shoulder: Secondary | ICD-10-CM

## 2022-11-01 MED ORDER — MELOXICAM 7.5 MG PO TABS: 7.5000 mg | ORAL_TABLET | Freq: Every day | ORAL | 1 refills | Status: DC

## 2022-11-01 NOTE — Progress Notes (Signed)
Office Visit Note   Patient: Kimberly Horne           Date of Birth: 05/27/1971           MRN: [id number] Visit Date: 11/01/2022              Requested by: Hoyt Koch, MD [address] [place],  [place] [address] PCP: Hoyt Koch, MD   Assessment & Plan: Visit Diagnoses:  1. Acute pain of right shoulder     Plan: Patient is a pleasant 52 year old-year-old woman with on and off pain in her right shoulder.  She has had this before and done some therapy.  She has a history of cervical scoliosis so she often gets pain that is in her neck but she believes this is separate problem.  She thinks she has been sleeping on the side a lot may have aggravated it.  She is followed by a spine doctor for her scoliosis.  Exam today consistent with some rotator cuff tendinitis though her strength was quite good.  We discussed possible options.  She has done therapy in the past and would like to try this again.  Also would put her on an anti-inflammatory which has helped in the past.  She may follow-up in a month.  Obviously if she does not improve could consider an MRI  Follow-Up Instructions: Return in about 1 month (around 12/02/2022), or if symptoms worsen or fail to improve.   Orders:  Orders Placed This Encounter  Procedures   XR Shoulder Right   Ambulatory referral to Physical Therapy   Meds ordered this encounter  Medications   meloxicam (MOBIC) 7.5 MG tablet    Sig: Take 1 tablet (7.5 mg total) by mouth daily.    Dispense:  30 tablet    Refill:  1      Procedures: No procedures performed   Clinical Data: No additional findings.   Subjective: Chief Complaint  Patient presents with   Right Shoulder - Pain    HPI  Review of Systems  All other systems reviewed and are negative.    Objective: Vital Signs: There were no vitals taken for this visit.  Physical Exam Constitutional:      Appearance: Normal appearance.  Pulmonary:     Effort:  Pulmonary effort is normal.  Skin:    General: Skin is warm and dry.  Neurological:     General: No focal deficit present.     Mental Status: She is alert.    Ortho Exam Examination of her right shoulder she has full forward elevation but does have some discomfort.  She has good internal rotation behind her back.  She is neurovascularly intact.  She does have a positive empty can test.  She has good strength with resisted external/internal rotation and abduction.  She does have positive impingement findings mild pain with external rotation Specialty Comments:  No specialty comments available.  Imaging: XR Shoulder Right  Result Date: 11/01/2022 X-rays of her right shoulder demonstrate well-maintained alignment of the glenohumeral joint.  No significant degenerative changes no evidence of any fracture    PMFS History: Patient Active Problem List   Diagnosis Date Noted   Pain in right shoulder 11/01/2022   Upper respiratory tract infection 04/03/2022   [name] without gangrene 06/15/2021   Arthritis of carpometacarpal [facility]) joint of left thumb 06/15/2021   Fibromyalgia 06/15/2021   Vitamin D deficiency 06/02/2020   Past Medical History:  Diagnosis Date  Osteoarthritis    Scoliosis     Family History  Problem Relation Age of Onset   Hyperlipidemia Mother    Hypertension Mother    Dementia Mother    Diabetes Father    Alzheimer's disease Father    Healthy Sister    Healthy Brother    Healthy Brother    Healthy Daughter    Healthy Daughter     Past Surgical History:  Procedure Laterality Date   CHOLECYSTECTOMY  2002   COCCYX REMOVAL  1997   HAND SURGERY Right    cyst removed   Social History   Occupational History   Not on file  Tobacco Use   Smoking status: Never    Passive exposure: Never   Smokeless tobacco: Never  Vaping Use   Vaping Use: Never used  Substance and Sexual Activity   Alcohol use: Not Currently   Drug use: Never   Sexual  activity: Not on file

## 2023-02-01 ENCOUNTER — Other Ambulatory Visit: Payer: Self-pay | Admitting: Physician Assistant

## 2023-02-05 NOTE — Progress Notes (Signed)
Office Visit Note  Patient: Kimberly Horne             Date of Birth: 1970-09-23           MRN: [id number]             PCP: Myrlene Broker, MD Referring: Myrlene Broker, * Visit Date: 02/19/2023 Occupation: @GUAROCC @  Subjective:  Neck and shoulder pain  History of Present Illness: Koa Crickmore is a 52 y.o. female fibromyalgia, history of Tritus and degenerative disc disease.  She states about 4 months ago she started having pain in her left shoulder joint.  At [facility] and was advised to take [facility]oxicam.  She states a month later she started having stiffness and discomfort in her right shoulder.  She states the pain was sharp and in her shoulders.  She states she has been sleeping late and has not been sleeping in the proper position which she thinks has been exacerbating her shoulder joint discomfort.  About a month ago she started taking meloxicam and the symptoms improved.  She continues to have some neck discomfort but none of the other joints are painful.  The left foot pain has completely resolved.  She denies any discomfort in her knees.  She has no upper back pain.  She has noticed more redness in her hands during the summer months.  Her rings have been tight.  She has not noticed typical Raynauds symptoms.  She denies any muscular pain.    Activities of Daily Living:  Patient reports morning stiffness for 15-20 minutes.   Patient Reports nocturnal pain.  Difficulty dressing/grooming: Denies Difficulty climbing stairs: Denies Difficulty getting out of chair: Denies Difficulty using hands for taps, buttons, cutlery, and/or writing: Denies  Review of Systems  Constitutional:  Negative for fatigue.  HENT:  Negative for mouth sores and mouth dryness.   Eyes:  Negative for dryness.  Respiratory:  Negative for shortness of breath.   Cardiovascular:  Negative for chest pain and palpitations.  Gastrointestinal:  Negative for  blood in stool, constipation and diarrhea.  Endocrine: Negative for increased urination.  Genitourinary:  Negative for involuntary urination.  Musculoskeletal:  Positive for joint pain, joint pain, myalgias, morning stiffness, muscle tenderness and myalgias. Negative for gait problem, joint swelling and muscle weakness.  Skin:  Negative for color change, rash, hair loss and sensitivity to sunlight.  Allergic/Immunologic: Negative for susceptible to infections.  Neurological:  Negative for dizziness and headaches.  Hematological:  Negative for swollen glands.  Psychiatric/Behavioral:  Positive for sleep disturbance. Negative for depressed mood. The patient is not nervous/anxious.     PMFS History:  Patient Active Problem List   Diagnosis Date Noted   Pain in right shoulder 11/01/2022   Upper respiratory tract infection 04/03/2022   [name] without gangrene 06/15/2021   Arthritis of carpometacarpal [facility]) joint of left thumb 06/15/2021   Fibromyalgia 06/15/2021   Vitamin D deficiency 06/02/2020    Past Medical History:  Diagnosis Date   Osteoarthritis    Scoliosis     Family History  Problem Relation Age of Onset   Hyperlipidemia Mother    Hypertension Mother    Dementia Mother    Diabetes Father    Alzheimer's disease Father    Healthy Sister    Healthy Brother    Healthy Brother    Healthy Daughter    Healthy Daughter    Past Surgical History:  Procedure Laterality  Date   CHOLECYSTECTOMY  2002   COCCYX REMOVAL  1997   HAND SURGERY Right    cyst removed   Social History   Social History Narrative   Not on file   Immunization History  Administered Date(s) Administered   Moderna SARS-COV2 Booster Vaccination 06/17/2020   Moderna Sars-Covid-2 Vaccination 09/24/2019, 10/27/2019, 06/17/2020   PPD Test 12/11/2017   Tdap 02/24/2013     Objective: Vital Signs: BP (!) 147/87 (BP Location: Right Arm, Patient Position: Sitting, Cuff Size: Normal)   Pulse  78   Resp 16   Ht 5\' 2"  (1.575 m)   Wt 134 lb 6.4 oz (61 kg)   BMI 24.58 kg/m    Physical Exam Vitals and nursing note reviewed.  Constitutional:      Appearance: She is well-developed.  HENT:     Head: Normocephalic and atraumatic.  Eyes:     Conjunctiva/sclera: Conjunctivae normal.  Cardiovascular:     Rate and Rhythm: Normal rate and regular rhythm.     Heart sounds: Normal heart sounds.  Pulmonary:     Effort: Pulmonary effort is normal.     Breath sounds: Normal breath sounds.  Abdominal:     General: Bowel sounds are normal.     Palpations: Abdomen is soft.  Musculoskeletal:     Cervical back: Normal range of motion.  Lymphadenopathy:     Cervical: No cervical adenopathy.  Skin:    General: Skin is warm and dry.     Capillary Refill: Capillary refill takes less than 2 seconds.  Neurological:     Mental Status: She is alert and oriented to person, place, and time.  Psychiatric:        Behavior: Behavior normal.      Musculoskeletal Exam: She had good range of motion of the cervical spine.  She had bilateral trapezius spasm.  There was no tenderness over thoracic or lumbar spine.  She had good mobility in thoracic and lumbar spine.  Shoulders, elbow joints, wrist joints, MCPs PIPs and DIPs were in good range of motion with no synovitis.  She had no tenderness or discomfort range of motion of her hip joints.  Knee joints and ankle joints were in good range of motion.  There was no tenderness over MTPs.  CDAI Exam: CDAI Score: -- Patient Global: --; Provider Global: -- Swollen: --; Tender: -- Joint Exam 02/19/2023   No joint exam has been documented for this visit   There is currently no information documented on the homunculus. Go to the Rheumatology activity and complete the homunculus joint exam.  Investigation: No additional findings.  Imaging: No results found.  Recent Labs: Lab Results  Component Value Date   WBC 6.9 08/21/2022   HGB 12.7 08/21/2022    PLT 309 08/21/2022   NA 141 08/21/2022   K 4.5 08/21/2022   CL 105 08/21/2022   CO2 28 08/21/2022   GLUCOSE 74 08/21/2022   BUN 13 08/21/2022   CREATININE 0.55 08/21/2022   BILITOT 0.4 08/21/2022   ALKPHOS 74 03/04/2019   AST 16 08/21/2022   ALT 17 08/21/2022   PROT 7.3 08/21/2022   ALBUMIN 4.0 03/04/2019   CALCIUM 9.3 08/21/2022   GFRAA 127 06/02/2020    Speciality Comments: No specialty comments available.  Procedures:  No procedures performed Allergies: Patient has no known allergies.   Assessment / Plan:     Visit Diagnoses: Chronic pain of both shoulders-she started experiencing pain and discomfort in her shoulders in March.  [facility]  She states she has been taking meloxicam which almost resolved her shoulder joint discomfort.  She believes the shoulder joint pain started after lifting light weights.  She had good range of motion of bilateral shoulders without any discomfort today.  I offered physical therapy which she declined.  I gave her a handout on shoulder joint strengthening exercises.  Benefits of water aerobics and summing were also discussed.  Arthritis of carpometacarpal (CMC) joint of left thumb-she has intermittent discomfort in her hands.  She had no tenderness on palpation today.  Chondromalacia of both patellae-she has intermittent pain in her knee joints.  No warmth swelling or effusion was noted.  Lower extremity exercises were discussed.  DDD (degenerative disc disease), cervical-she had bilateral trapezius spasm.  She continues to have pain and discomfort in her cervical spine.  A handout on C-spine exercises was given.  DDD (degenerative disc disease), thoracic-she had no point tenderness on the examination today.  She had good mobility in thoracic and lumbar spine.  Positive ANA (antinuclear antibody) - [facility] 12/13/2020: Index -1.6.  ANA 1: 320 NS, indeterminate dsDNA rest of panel negative.  She denies fatigue, oral ulcers, nasal  ulcers, malar rash, photosensitivity, Raynaud's or lymphadenopathy.  She gets Raynauds symptoms during the winter months.  She has no clinical features of systemic lupus at this time. - Plan: ANA, Anti-DNA antibody, double-stranded, C3 and C4, Sedimentation rate, CBC with Differential/Platelet, COMPLETE METABOLIC PANEL WITH GFR  [name] without gangrene-currently asymptomatic.  She states she gets redness in her hands and puffy hands during the summer months.  Other insomnia-she continues to have insomnia.  She states she does not have good sleep hygiene and sleeps in awkward position which causes neck and shoulder pain.  Sleep hygiene was discussed.  Fibromyalgia-she is not experiencing muscle pain currently.  Vitamin D deficiency-she had vitamin D deficiency in the past.  Vitamin D was normal at 7 on August 21, 2022.  Elevated blood pressure reading-blood pressure was elevated at 147/87 today.  Repeat blood pressure was 132/83.  She was advised to monitor blood pressure closely and follow-up with the PCP if her blood pressure stays elevated.  Family history of rheumatoid arthritis  Orders: Orders Placed This Encounter  Procedures   ANA   Anti-DNA antibody, double-stranded   C3 and C4   Sedimentation rate   CBC with Differential/Platelet   COMPLETE METABOLIC PANEL WITH GFR   No orders of the defined types were placed in this encounter.    Follow-Up Instructions: Return in about 6 months (around 08/22/2023) for Osteoarthritis, +ANA.   Pollyann Savoy, MD  Note - This record has been created using Animal nutritionist.  Chart creation errors have been sought, but may not always  have been located. Such creation errors do not reflect on  the standard of medical care.

## 2023-02-19 ENCOUNTER — Encounter: Payer: Self-pay | Admitting: Rheumatology

## 2023-02-19 ENCOUNTER — Ambulatory Visit: Payer: Managed Care, Other (non HMO) | Attending: Rheumatology | Admitting: Rheumatology

## 2023-02-19 VITALS — BP 132/83 | HR 71 | Resp 16 | Ht 62.0 in | Wt 134.4 lb

## 2023-02-19 DIAGNOSIS — M2242 Chondromalacia patellae, left knee: Secondary | ICD-10-CM

## 2023-02-19 DIAGNOSIS — M797 Fibromyalgia: Secondary | ICD-10-CM

## 2023-02-19 DIAGNOSIS — M1812 Unilateral primary osteoarthritis of first carpometacarpal joint, left hand: Secondary | ICD-10-CM | POA: Diagnosis not present

## 2023-02-19 DIAGNOSIS — M25511 Pain in right shoulder: Secondary | ICD-10-CM | POA: Diagnosis not present

## 2023-02-19 DIAGNOSIS — R768 Other specified abnormal immunological findings in serum: Secondary | ICD-10-CM

## 2023-02-19 DIAGNOSIS — I7300 Raynaud's syndrome without gangrene: Secondary | ICD-10-CM

## 2023-02-19 DIAGNOSIS — M5030 Other cervical disc degeneration, unspecified cervical region: Secondary | ICD-10-CM | POA: Diagnosis not present

## 2023-02-19 DIAGNOSIS — M2241 Chondromalacia patellae, right knee: Secondary | ICD-10-CM

## 2023-02-19 DIAGNOSIS — R030 Elevated blood-pressure reading, without diagnosis of hypertension: Secondary | ICD-10-CM

## 2023-02-19 DIAGNOSIS — M79672 Pain in left foot: Secondary | ICD-10-CM

## 2023-02-19 DIAGNOSIS — Z8261 Family history of arthritis: Secondary | ICD-10-CM

## 2023-02-19 DIAGNOSIS — G8929 Other chronic pain: Secondary | ICD-10-CM

## 2023-02-19 DIAGNOSIS — M5134 Other intervertebral disc degeneration, thoracic region: Secondary | ICD-10-CM

## 2023-02-19 DIAGNOSIS — G4709 Other insomnia: Secondary | ICD-10-CM

## 2023-02-19 DIAGNOSIS — M25512 Pain in left shoulder: Secondary | ICD-10-CM

## 2023-02-19 DIAGNOSIS — E559 Vitamin D deficiency, unspecified: Secondary | ICD-10-CM

## 2023-02-19 LAB — CBC WITH DIFFERENTIAL/PLATELET
MCH: 27.4 pg (ref 27.0–33.0)
Neutro Abs: 5211 cells/uL (ref 1500–7800)
Platelets: 308 10*3/uL (ref 140–400)
RDW: 14.9 % (ref 11.0–15.0)

## 2023-02-19 NOTE — Patient Instructions (Addendum)
Cervical Strain and Sprain Rehab Ask your health care provider which exercises are safe for you. Do exercises exactly as told by your health care provider and adjust them as directed. It is normal to feel mild stretching, pulling, tightness, or discomfort as you do these exercises. Stop right away if you feel sudden pain or your pain gets worse. Do not begin these exercises until told by your health care provider. Stretching and range-of-motion exercises Cervical side bending  Using good posture, sit on a stable chair or stand up. Without moving your shoulders, slowly tilt your left / right ear to your shoulder until you feel a stretch in the neck muscles on the opposite side. You should be looking straight ahead. Hold for __________ seconds. Repeat with the other side of your neck. Repeat __________ times. Complete this exercise __________ times a day. Cervical rotation  Using good posture, sit on a stable chair or stand up. Slowly turn your head to the side as if you are looking over your left / right shoulder. Keep your eyes level with the ground. Stop when you feel a stretch along the side and the back of your neck. Hold for __________ seconds. Repeat this by turning to your other side. Repeat __________ times. Complete this exercise __________ times a day. Thoracic extension and pectoral stretch  Roll a towel or a small blanket so it is about 4 inches (10 cm) in diameter. Lie down on your back on a firm surface. Put the towel in the middle of your back across your spine. It should not be under your shoulder blades. Put your hands behind your head and let your elbows fall out to your sides. Hold for __________ seconds. Repeat __________ times. Complete this exercise __________ times a day. Strengthening exercises Upper cervical flexion  Lie on your back with a thin pillow behind your head or a small, rolled-up towel under your neck. Gently tuck your chin toward your chest and nod  your head down to look toward your feet. Do not lift your head off the pillow. Hold for __________ seconds. Release the tension slowly. Relax your neck muscles completely before you repeat this exercise. Repeat __________ times. Complete this exercise __________ times a day. Cervical extension  Stand about 6 inches (15 cm) away from a wall, with your back facing the wall. Place a soft object, about 6-8 inches (15-20 cm) in diameter, between the back of your head and the wall. A soft object could be a small pillow, a ball, or a folded towel. Gently tilt your head back and press into the soft object. Keep your jaw and forehead relaxed. Hold for __________ seconds. Release the tension slowly. Relax your neck muscles completely before you repeat this exercise. Repeat __________ times. Complete this exercise __________ times a day. Posture and body mechanics Body mechanics refer to the movements and positions of your body while you do your daily activities. Posture is part of body mechanics. Good posture and healthy body mechanics can help to relieve stress in your body's tissues and joints. Good posture means that your spine is in its natural S-curve position (your spine is neutral), your shoulders are pulled back slightly, and your head is not tipped forward. The following are general guidelines for using improved posture and body mechanics in your everyday activities. Sitting  When sitting, keep your spine neutral and keep your feet flat on the floor. Use a footrest, if needed, and keep your thighs parallel to the floor. Avoid rounding  your shoulders. Avoid tilting your head forward. When working at a desk or a computer, keep your desk at a height where your hands are slightly lower than your elbows. Slide your chair under your desk so you are close enough to maintain good posture. When working at a computer, place your monitor at a height where you are looking straight ahead and you do not have to  tilt your head forward or downward to look at the screen. Standing  When standing, keep your spine neutral and keep your feet about hip-width apart. Keep a slight bend in your knees. Your ears, shoulders, and hips should line up. When you do a task in which you stand in one place for a long time, place one foot up on a stable object that is 2-4 inches (5-10 cm) high, such as a footstool. This helps keep your spine neutral. Resting When lying down and resting, avoid positions that are most painful for you. Try to support your neck in a neutral position. You can use a contour pillow or a small rolled-up towel. Your pillow should support your neck but not push on it. This information is not intended to replace advice given to you by your health care provider. Make sure you discuss any questions you have with your health care provider. Document Revised: 02/19/2022 Document Reviewed: 02/19/2022 Elsevier Patient Education  2024 [employer] Shoulder Exercises Ask your health care provider which exercises are safe for you. Do exercises exactly as told by your health care provider and adjust them as directed. It is normal to feel mild stretching, pulling, tightness, or discomfort as you do these exercises. Stop right away if you feel sudden pain or your pain gets worse. Do not begin these exercises until told by your health care provider. Stretching exercises External rotation and abduction This exercise is sometimes called corner stretch. The exercise rotates your arm outward (external rotation) and moves your arm out from your body (abduction). Stand in a doorway with one of your feet slightly in front of the other. This is called a staggered stance. If you cannot reach your forearms to the door frame, stand facing a corner of a room. Choose one of the following positions as told by your health care provider: Place your hands and forearms on the door frame above your head. Place your hands and forearms  on the door frame at the height of your head. Place your hands on the door frame at the height of your elbows. Slowly move your weight onto your front foot until you feel a stretch across your chest and in the front of your shoulders. Keep your head and chest upright and keep your abdominal muscles tight. Hold for __________ seconds. To release the stretch, shift your weight to your back foot. Repeat __________ times. Complete this exercise __________ times a day. Extension, standing  Stand and hold a broomstick, a cane, or a similar object behind your back. Your hands should be a little wider than shoulder-width apart. Your palms should face away from your back. Keeping your elbows straight and your shoulder muscles relaxed, move the stick away from your body until you feel a stretch in your shoulders (extension). Avoid shrugging your shoulders while you move the stick. Keep your shoulder blades tucked down toward the middle of your back. Hold for __________ seconds. Slowly return to the starting position. Repeat __________ times. Complete this exercise __________ times a day. Range-of-motion exercises Pendulum  Stand near a wall or a  surface that you can hold onto for balance. Bend at the waist and let your left / right arm hang straight down. Use your other arm to support you. Keep your back straight and do not lock your knees. Relax your left / right arm and shoulder muscles, and move your hips and your trunk so your left / right arm swings freely. Your arm should swing because of the motion of your body, not because you are using your arm or shoulder muscles. Keep moving your hips and trunk so your arm swings in the following directions, as told by your health care provider: Side to side. Forward and backward. In clockwise and counterclockwise circles. Continue each motion for __________ seconds, or for as long as told by your health care provider. Slowly return to the starting  position. Repeat __________ times. Complete this exercise __________ times a day. Shoulder flexion, standing  Stand and hold a broomstick, a cane, or a similar object. Place your hands a little more than shoulder-width apart on the object. Your left / right hand should be palm-up, and your other hand should be palm-down. Keep your elbow straight and your shoulder muscles relaxed. Push the stick up with your healthy arm to raise your left / right arm in front of your body, and then over your head until you feel a stretch in your shoulder (flexion). Avoid shrugging your shoulder while you raise your arm. Keep your shoulder blade tucked down toward the middle of your back. Hold for __________ seconds. Slowly return to the starting position. Repeat __________ times. Complete this exercise __________ times a day. Shoulder abduction, standing  Stand and hold a broomstick, a cane, or a similar object. Place your hands a little more than shoulder-width apart on the object. Your left / right hand should be palm-up, and your other hand should be palm-down. Keep your elbow straight and your shoulder muscles relaxed. Push the object across your body toward your left / right side. Raise your left / right arm to the side of your body (abduction) until you feel a stretch in your shoulder. Do not raise your arm above shoulder height unless your health care provider tells you to do that. If directed, raise your arm over your head. Avoid shrugging your shoulder while you raise your arm. Keep your shoulder blade tucked down toward the middle of your back. Hold for __________ seconds. Slowly return to the starting position. Repeat __________ times. Complete this exercise __________ times a day. Internal rotation  Place your left / right hand behind your back, palm-up. Use your other hand to dangle an exercise band, a broomstick, or a similar object over your shoulder. Grasp the band with your left / right hand so  you are holding on to both ends. Gently pull up on the band until you feel a stretch in the front of your left / right shoulder. The movement of your arm toward the center of your body is called internal rotation. Avoid shrugging your shoulder while you raise your arm. Keep your shoulder blade tucked down toward the middle of your back. Hold for __________ seconds. Release the stretch by letting go of the band and lowering your hands. Repeat __________ times. Complete this exercise __________ times a day. Strengthening exercises External rotation  Sit in a stable chair without armrests. Secure an exercise band to a stable object at elbow height on your left / right side. Place a soft object, such as a folded towel or a small  pillow, between your left / right upper arm and your body to move your elbow about 4 inches (10 cm) away from your side. Hold the end of the exercise band so it is tight and there is no slack. Keeping your elbow pressed against the soft object, slowly move your forearm out, away from your abdomen (external rotation). Keep your body steady so only your forearm moves. Hold for __________ seconds. Slowly return to the starting position. Repeat __________ times. Complete this exercise __________ times a day. Shoulder abduction  Sit in a stable chair without armrests, or stand up. Hold a __________ lb / kg weight in your left / right hand, or hold an exercise band with both hands. Start with your arms straight down and your left / right palm facing in, toward your body. Slowly lift your left / right hand out to your side (abduction). Do not lift your hand above shoulder height unless your health care provider tells you that this is safe. Keep your arms straight. Avoid shrugging your shoulder while you do this movement. Keep your shoulder blade tucked down toward the middle of your back. Hold for __________ seconds. Slowly lower your arm, and return to the starting  position. Repeat __________ times. Complete this exercise __________ times a day. Shoulder extension  Sit in a stable chair without armrests, or stand up. Secure an exercise band to a stable object in front of you so it is at shoulder height. Hold one end of the exercise band in each hand. Straighten your elbows and lift your hands up to shoulder height. Squeeze your shoulder blades together as you pull your hands down to the sides of your thighs (extension). Stop when your hands are straight down by your sides. Do not let your hands go behind your body. Hold for __________ seconds. Slowly return to the starting position. Repeat __________ times. Complete this exercise __________ times a day. Shoulder row  Sit in a stable chair without armrests, or stand up. Secure an exercise band to a stable object in front of you so it is at chest height. Hold one end of the exercise band in each hand. Position your palms so that your thumbs are facing the ceiling (neutral position). Bend each of your elbows to a 90-degree angle (right angle) and keep your upper arms at your sides. Step back or move the chair back until the band is tight and there is no slack. Slowly pull your elbows back behind you. Hold for __________ seconds. Slowly return to the starting position. Repeat __________ times. Complete this exercise __________ times a day. Shoulder press-ups  Sit in a stable chair that has armrests. Sit upright, with your feet flat on the floor. Put your hands on the armrests so your elbows are bent and your fingers are pointing forward. Your hands should be about even with the sides of your body. Push down on the armrests and use your arms to lift yourself off the chair. Straighten your elbows and lift yourself up as much as you comfortably can. Move your shoulder blades down, and avoid letting your shoulders move up toward your ears. Keep your feet on the ground. As you get stronger, your feet should  support less of your body weight as you lift yourself up. Hold for __________ seconds. Slowly lower yourself back into the chair. Repeat __________ times. Complete this exercise __________ times a day. Wall push-ups  Stand so you are facing a stable wall. Your feet should be about  one arm-length away from the wall. Lean forward and place your palms on the wall at shoulder height. Keep your feet flat on the floor as you bend your elbows and lean forward toward the wall. Hold for __________ seconds. Straighten your elbows to push yourself back to the starting position. Repeat __________ times. Complete this exercise __________ times a day. This information is not intended to replace advice given to you by your health care provider. Make sure you discuss any questions you have with your health care provider. Document Revised: 09/19/2021 Document Reviewed: 09/19/2021 Elsevier Patient Education  2024 [employer].

## 2023-02-20 LAB — COMPLETE METABOLIC PANEL WITH GFR
AG Ratio: 1.3 (calc) (ref 1.0–2.5)
ALT: 16 U/L (ref 6–29)
Albumin: 4.4 g/dL (ref 3.6–5.1)
Alkaline phosphatase (APISO): 95 U/L (ref 37–153)
BUN: 12 mg/dL (ref 7–25)
Calcium: 10 mg/dL (ref 8.6–10.4)
Chloride: 102 mmol/L (ref 98–110)
Globulin: 3.5 g/dL (calc) (ref 1.9–3.7)
Potassium: 4.2 mmol/L (ref 3.5–5.3)
Sodium: 141 mmol/L (ref 135–146)

## 2023-02-20 LAB — SEDIMENTATION RATE: Sed Rate: 39 mm/h — ABNORMAL HIGH (ref 0–30)

## 2023-02-20 LAB — CBC WITH DIFFERENTIAL/PLATELET
Absolute Monocytes: 672 cells/uL (ref 200–950)
Basophils Absolute: 43 cells/uL (ref 0–200)
Eosinophils Absolute: 213 cells/uL (ref 15–500)
Eosinophils Relative: 2.5 %
HCT: 39.8 % (ref 35.0–45.0)
MCHC: 33.2 g/dL (ref 32.0–36.0)
MCV: 82.6 fL (ref 80.0–100.0)
MPV: 9.7 fL (ref 7.5–12.5)
WBC: 8.5 10*3/uL (ref 3.8–10.8)

## 2023-02-20 LAB — ANTI-DNA ANTIBODY, DOUBLE-STRANDED: ds DNA Ab: 2 IU/mL

## 2023-02-21 LAB — CBC WITH DIFFERENTIAL/PLATELET
Basophils Relative: 0.5 %
Hemoglobin: 13.2 g/dL (ref 11.7–15.5)
Lymphs Abs: 2363 cells/uL (ref 850–3900)
Monocytes Relative: 7.9 %
Neutrophils Relative %: 61.3 %
RBC: 4.82 10*6/uL (ref 3.80–5.10)
Total Lymphocyte: 27.8 %

## 2023-02-21 LAB — ANA: Anti Nuclear Antibody (ANA): POSITIVE — AB

## 2023-02-21 LAB — COMPLETE METABOLIC PANEL WITH GFR
AST: 15 U/L (ref 10–35)
CO2: 30 mmol/L (ref 20–32)
Creat: 0.61 mg/dL (ref 0.50–1.03)
Glucose, Bld: 84 mg/dL (ref 65–99)
Total Bilirubin: 0.5 mg/dL (ref 0.2–1.2)
Total Protein: 7.9 g/dL (ref 6.1–8.1)
eGFR: 108 mL/min/{1.73_m2} (ref >=60)

## 2023-02-21 LAB — C3 AND C4
C3 Complement: 173 mg/dL (ref 83–193)
C4 Complement: 32 mg/dL (ref 15–57)

## 2023-02-21 LAB — ANTI-NUCLEAR AB-TITER (ANA TITER): ANA Titer 1: 1:80 {titer} — ABNORMAL HIGH

## 2023-02-21 NOTE — Progress Notes (Signed)
Sed rate is mildly elevated and stable.  Double-stranded DNA is negative.  CBC and CMP are normal.  Complements normal.  Labs do not indicate an autoimmune disease flare.

## 2023-05-17 ENCOUNTER — Ambulatory Visit: Payer: Managed Care, Other (non HMO) | Admitting: Internal Medicine

## 2023-06-03 ENCOUNTER — Encounter: Payer: Self-pay | Admitting: Internal Medicine

## 2023-06-03 ENCOUNTER — Ambulatory Visit: Payer: Managed Care, Other (non HMO) | Admitting: Internal Medicine

## 2023-06-03 VITALS — BP 120/82 | HR 74 | Temp 98.2°F | Ht 62.0 in | Wt 134.0 lb

## 2023-06-03 DIAGNOSIS — Z23 Encounter for immunization: Secondary | ICD-10-CM | POA: Diagnosis not present

## 2023-06-03 DIAGNOSIS — Z0000 Encounter for general adult medical examination without abnormal findings: Secondary | ICD-10-CM | POA: Diagnosis not present

## 2023-06-03 LAB — LIPID PANEL
Cholesterol: 206 mg/dL — ABNORMAL HIGH (ref 0–200)
HDL: 48.5 mg/dL (ref >39.00)
LDL Cholesterol: 130 mg/dL — ABNORMAL HIGH (ref 0–99)
NonHDL: 157.76
Total CHOL/HDL Ratio: 4
Triglycerides: 137 mg/dL (ref 0.0–149.0)
VLDL: 27.4 mg/dL (ref 0.0–40.0)

## 2023-06-03 LAB — HEMOGLOBIN A1C: Hgb A1c MFr Bld: 5.8 % (ref 4.6–6.5)

## 2023-06-03 NOTE — Progress Notes (Signed)
   Subjective:   Patient ID: Kimberly Horne, female    DOB: 1970/12/27, 52 y.o.   MRN: [id number]  HPI The patient is here for physical. [facility] colonoscopy about 2 years ago. [facility] [facility] for mammogram and pap.   PMH, [facility], social history reviewed and updated  Review of Systems  Constitutional: Negative.   HENT: Negative.    Eyes: Negative.   Respiratory:  Negative for cough, chest tightness and shortness of breath.   Cardiovascular:  Negative for chest pain, palpitations and leg swelling.  Gastrointestinal:  Negative for abdominal distention, abdominal pain, constipation, diarrhea, nausea and vomiting.  Musculoskeletal: Negative.   Skin: Negative.   Neurological: Negative.   Psychiatric/Behavioral: Negative.      Objective:  Physical Exam Constitutional:      Appearance: She is well-developed.  HENT:     Head: Normocephalic and atraumatic.  Cardiovascular:     Rate and Rhythm: Normal rate and regular rhythm.  Pulmonary:     Effort: Pulmonary effort is normal. No respiratory distress.     Breath sounds: Normal breath sounds. No wheezing or rales.  Abdominal:     General: Bowel sounds are normal. There is no distension.     Palpations: Abdomen is soft.     Tenderness: There is no abdominal tenderness. There is no rebound.  Musculoskeletal:     Cervical back: Normal range of motion.  Skin:    General: Skin is warm and dry.  Neurological:     Mental Status: She is alert and oriented to person, place, and time.     Coordination: Coordination normal.     Vitals:   06/03/23 0839  BP: 120/82  Pulse: 74  Temp: 98.2 F (36.8 C)  TempSrc: Oral  SpO2: 97%  Weight: 134 lb (60.8 kg)  Height: 5\' 2"  (1.575 m)    Assessment & Plan:  Flu shot given at visit

## 2023-06-03 NOTE — Assessment & Plan Note (Signed)
Flu shot given. Shingrix counseled she will schedule. Tetanus counseled. Colonoscopy had done getting records. Mammogram had done getting records, pap smear due 2025. Counseled about sun safety and mole surveillance. Counseled about the dangers of distracted driving. Given 10 year screening recommendations.

## 2023-06-20 ENCOUNTER — Ambulatory Visit: Payer: Managed Care, Other (non HMO)

## 2023-06-20 DIAGNOSIS — Z23 Encounter for immunization: Secondary | ICD-10-CM | POA: Diagnosis not present

## 2023-06-20 NOTE — Progress Notes (Signed)
Patient visits today to receive her sHINGRIX vaccine. Patient was informed and tolerated well. Patient was notified to reach out to [place] if needed.

## 2023-07-15 ENCOUNTER — Ambulatory Visit: Payer: Managed Care, Other (non HMO) | Admitting: Physician Assistant

## 2023-07-16 ENCOUNTER — Encounter: Payer: Self-pay | Admitting: Physician Assistant

## 2023-07-16 ENCOUNTER — Ambulatory Visit: Payer: Managed Care, Other (non HMO) | Admitting: Physician Assistant

## 2023-07-16 ENCOUNTER — Other Ambulatory Visit (INDEPENDENT_AMBULATORY_CARE_PROVIDER_SITE_OTHER): Payer: Self-pay

## 2023-07-16 DIAGNOSIS — M79671 Pain in right foot: Secondary | ICD-10-CM | POA: Insufficient documentation

## 2023-07-16 NOTE — Progress Notes (Signed)
Office Visit Note   Patient: Kimberly Horne           Date of Birth: 1971-02-27           MRN: [id number] Visit Date: 07/16/2023              Requested by: Myrlene Broker, MD [address] [place],  [place] [address] PCP: Myrlene Broker, MD   Assessment & Plan: Visit Diagnoses:  1. Pain of right heel   2. Inflammatory pain of right heel     Plan: Patient is a pleasant 52 year old Horne with a 10-day history of right posterior plantar heel pain.  Denies any injuries.  Notices it is more painful when she stretches her foot.  Especially at night and then when she stretches her foot it hurts.  She does have a small spur off the posterior insertion of the Achilles tendon.  I think she is got some plantar fasciitis and insertional Achilles tendinitis.  We talked about the natural history of this I encouraged her to wear supportive shoes such as sneakers I given her some heel lifts I also encouraged to do gentle stretching of her Achilles and the plantar fascia demonstrated these to her today can also use Voltaren gel may follow-up as needed  Follow-Up Instructions: As needed  Orders:  Orders Placed This Encounter  Procedures   XR Foot Complete Right   No orders of the defined types were placed in this encounter.     Procedures: No procedures performed   Clinical Data: No additional findings.   Subjective: Chief Complaint  Patient presents with   Right Foot - Pain    HPI patient is a pleasant Kimberly Horne who complains of posterior heel pain x 10 days.  Denies any injury describes the pain as sharp and pulling she has pain with certain movements and how she walks.  Slightly better than yesterday.  She not taking anything for pain has not tried heat or ice  Review of Systems  All other systems reviewed and are negative.    Objective: Vital Signs: There were no vitals taken for this visit.  Physical Exam Constitutional:      Appearance:  Normal appearance.  Pulmonary:     Effort: Pulmonary effort is normal.  Skin:    General: Skin is warm and dry.  Neurological:     Mental Status: She is alert.  Psychiatric:        Mood and Affect: Mood normal.        Behavior: Behavior normal.     Ortho Exam Right heel she is neurovascularly intact pulses are intact no swelling no redness no erythema Achilles is functioning and palpable.  She does have some tenderness over the posterior plantar tip of the heel.  Increased with dorsiflexion.  Good strength in all planes.  Compartments are soft and nontender Specialty Comments:  No specialty comments available.  Imaging: No results found.   PMFS History: Patient Active Problem List   Diagnosis Date Noted   Inflammatory pain of right heel 07/16/2023   Pain in right shoulder 11/01/2022   [name] without gangrene 06/15/2021   Arthritis of carpometacarpal [facility]) joint of left thumb 06/15/2021   Fibromyalgia 06/15/2021   Vitamin D deficiency 06/02/2020   Routine general medical examination at a health care facility 01/24/2016   Past Medical History:  Diagnosis Date   Osteoarthritis    Scoliosis     Family History  Problem Relation Age  of Onset   Hyperlipidemia Mother    Hypertension Mother    Dementia Mother    Diabetes Father    Alzheimer's disease Father    Healthy Sister    Healthy Brother    Healthy Brother    Healthy Daughter    Healthy Daughter     Past Surgical History:  Procedure Laterality Date   CHOLECYSTECTOMY  2002   COCCYX REMOVAL  1997   HAND SURGERY Right    cyst removed   Social History   Occupational History   Not on file  Tobacco Use   Smoking status: Never    Passive exposure: Never   Smokeless tobacco: Never  Vaping Use   Vaping status: Never Used  Substance and Sexual Activity   Alcohol use: Not Currently   Drug use: Never   Sexual activity: Not on file

## 2023-08-06 ENCOUNTER — Telehealth: Payer: Managed Care, Other (non HMO) | Admitting: Nurse Practitioner

## 2023-08-06 DIAGNOSIS — U071 COVID-19: Secondary | ICD-10-CM

## 2023-08-06 MED ORDER — FLUTICASONE PROPIONATE 50 MCG/ACT NA SUSP: 2.0000 | Freq: Every day | NASAL | 6 refills | Status: DC

## 2023-08-06 MED ORDER — NIRMATRELVIR/RITONAVIR (PAXLOVID)TABLET: 3.0000 | ORAL_TABLET | Freq: Two times a day (BID) | ORAL | 0 refills | Status: AC

## 2023-08-06 NOTE — Progress Notes (Signed)
Virtual Visit Consent   Kimberly Horne, you are scheduled for a virtual visit with a [facility] provider today. Just as with appointments in the office, your consent must be obtained to participate. Your consent will be active for this visit and any virtual visit you may have with one of our providers in the next 365 days. If you have a MyChart account, a copy of this consent can be sent to you electronically.  As this is a virtual visit, video technology does not allow for your provider to perform a traditional examination. This may limit your provider's ability to fully assess your condition. If your provider identifies any concerns that need to be evaluated in person or the need to arrange testing (such as labs, EKG, etc.), we will make arrangements to do so. Although advances in technology are sophisticated, we cannot ensure that it will always work on either your end or our end. If the connection with a video visit is poor, the visit may have to be switched to a telephone visit. With either a video or telephone visit, we are not always able to ensure that we have a secure connection.  By engaging in this virtual visit, you consent to the provision of healthcare and authorize for your insurance to be billed (if applicable) for the services provided during this visit. Depending on your insurance coverage, you may receive a charge related to this service.  I need to obtain your verbal consent now. Are you willing to proceed with your visit today? Kimberly Horne has provided verbal consent on 08/06/2023 for a virtual visit (video or telephone). Viviano Simas, FNP  Date: 08/06/2023 2:34 PM  Virtual Visit via Video Note   I, Viviano Simas, connected with  Kimberly Horne  ([id number], 19-Dec-1970) on 08/06/23 at  2:30 PM EST by a video-enabled telemedicine application and verified that I am speaking with the correct person using two identifiers.  Location: Patient: Virtual Visit  Location Patient: Home Provider: Virtual Visit Location Provider: Home Office   I discussed the limitations of evaluation and management by telemedicine and the availability of in person appointments. The patient expressed understanding and agreed to proceed.    History of Present Illness: Kimberly Horne is a 52 y.o. who identifies as a female who was assigned female at birth, and is being seen today for sore throat, fatigue, loss of voice possible fever   She took a home COVID test today and it was positive  Symptom onset was 08/04/23  She has had COVID once in the past  She did take Paxlovid at that time  Denies SE from that   She has been using an over the counter chest congestion medication today    GFR in July 108 Denies any history of kidney disease   Problems:  Patient Active Problem List   Diagnosis Date Noted   Inflammatory pain of right heel 07/16/2023   Pain in right shoulder 11/01/2022   [name] without gangrene 06/15/2021   Arthritis of carpometacarpal [facility]) joint of left thumb 06/15/2021   Fibromyalgia 06/15/2021   Vitamin D deficiency 06/02/2020   Routine general medical examination at a health care facility 01/24/2016    Allergies: No Known Allergies Medications:  Current Outpatient Medications:    Cholecalciferol (VITAMIN D) 50 MCG (2000 UT) CAPS, Take by mouth daily., Disp: , Rfl:    Ginger, Zingiber officinalis, (GINGER ROOT PO), Take by mouth., Disp: , Rfl:    meloxicam (MOBIC) 7.5  MG tablet, Take 1 tablet (7.5 mg total) by mouth daily. (Patient not taking: Reported on 06/03/2023), Disp: 30 tablet, Rfl: 1   Multiple Vitamin (MULTIVITAMIN PO), Take by mouth daily., Disp: , Rfl:    Omega-3 Fatty Acids (FISH OIL PO), Take by mouth., Disp: , Rfl:    tiZANidine (ZANAFLEX) 4 MG tablet, Take 1 tablet (4 mg total) by mouth at bedtime. (Patient not taking: Reported on 08/21/2022), Disp: 14 tablet, Rfl: 0   TURMERIC PO, Take by mouth., Disp: , Rfl:    Observations/Objective: Patient is well-developed, well-nourished in no acute distress.  Resting comfortably  at home.  Head is normocephalic, atraumatic.  No labored breathing.  Speech is clear and coherent with logical content.  Patient is alert and oriented at baseline.    Assessment and Plan:   1. COVID-19 (Primary) Continue over the counter medications for support of symptoms as discussed  Push fluids and rest   - nirmatrelvir/ritonavir (PAXLOVID) 20 x 150 MG & 10 x 100MG  TABS; Take 3 tablets by mouth 2 (two) times daily for 5 days. (Take nirmatrelvir 150 mg two tablets twice daily for 5 days and ritonavir 100 mg one tablet twice daily for 5 days) Patient GFR is 108  Dispense: 30 tablet; Refill: 0 - fluticasone (FLONASE) 50 MCG/ACT nasal spray; Place 2 sprays into both nostrils daily.  Dispense: 16 g; Refill: 6     Follow Up Instructions: I discussed the assessment and treatment plan with the patient. The patient was provided an opportunity to ask questions and all were answered. The patient agreed with the plan and demonstrated an understanding of the instructions.  A copy of instructions were sent to the patient via MyChart unless otherwise noted below.    The patient was advised to call back or seek an in-person evaluation if the symptoms worsen or if the condition fails to improve as anticipated.    Viviano Simas, FNP

## 2023-08-09 NOTE — Progress Notes (Signed)
 Office Visit Note  Patient: Kimberly Horne             Date of Birth: [birth year]           MRN: [id number]             PCP: Rollene Almarie LABOR, MD Referring: Rollene Almarie LABOR, * Visit Date: 08/22/2023 Occupation: @GUAROCC @  Subjective:  Joint stiffness   History of Present Illness: Kimberly Horne is a 52 y.o. female with the osteoarthritis, degenerative disc disease and fibromyalgia syndrome.  She states she has been having some discomfort in her both shoulders.  She relates it to having massage therapy.  She states she had some recent pain in her right heel for which she was seen by orthopedics and was given some stretching exercises.  The symptoms are gradually improving.  She continues to have some stiffness in her hands.  She has neck and upper back discomfort off and on.  She denies any history of oral ulcers, nasal ulcers, malar rash, photosensitivity, Raynaud's, lymphadenopathy or inflammatory arthritis.  She continues to have some generalized pain from fibromyalgia.    Activities of Daily Living:  Patient reports morning stiffness for 3-4 minutes.   Patient Denies nocturnal pain.  Difficulty dressing/grooming: Denies Difficulty climbing stairs: Denies Difficulty getting out of chair: Denies Difficulty using hands for taps, buttons, cutlery, and/or writing: Denies  Review of Systems  Constitutional:  Negative for fatigue.  HENT:  Negative for mouth sores and mouth dryness.   Eyes:  Negative for dryness.  Respiratory:  Negative for shortness of breath.   Cardiovascular:  Negative for chest pain and palpitations.  Gastrointestinal:  Negative for blood in stool, constipation and diarrhea.  Endocrine: Negative for increased urination.  Genitourinary:  Negative for involuntary urination.  Musculoskeletal:  Positive for joint pain, joint pain and morning stiffness. Negative for gait problem, joint swelling, myalgias, muscle weakness, muscle tenderness and  myalgias.  Skin:  Negative for color change, rash, hair loss and sensitivity to sunlight.  Allergic/Immunologic: Negative for susceptible to infections.  Neurological:  Negative for dizziness and headaches.  Hematological:  Negative for swollen glands.  Psychiatric/Behavioral:  Negative for depressed mood and sleep disturbance. The patient is not nervous/anxious.     PMFS History:  Patient Active Problem List   Diagnosis Date Noted   Inflammatory pain of right heel 07/16/2023   Pain in right shoulder 11/01/2022   [name] without gangrene 06/15/2021   Arthritis of carpometacarpal [facility]) joint of left thumb 06/15/2021   Fibromyalgia 06/15/2021   Vitamin D  deficiency 06/02/2020   Routine general medical examination at a health care facility 01/24/2016    Past Medical History:  Diagnosis Date   Osteoarthritis    Scoliosis     Family History  Problem Relation Age of Onset   Hyperlipidemia Mother    Hypertension Mother    Dementia Mother    Diabetes Father    Alzheimer's disease Father    Healthy Sister    Healthy Brother    Healthy Brother    Healthy Daughter    Healthy Daughter    Past Surgical History:  Procedure Laterality Date   CHOLECYSTECTOMY  2002   COCCYX REMOVAL  1997   HAND SURGERY Right    cyst removed   Social History   Social History Narrative   Not on file   Immunization History  Administered Date(s) Administered   Influenza Inj Mdck Quad Pf 05/01/2019   Influenza, Seasonal, Injecte, Preservative  Fre 06/03/2023   Influenza-Unspecified 04/24/2019, 06/29/2021   Moderna SARS-COV2 Booster Vaccination 06/17/2020   Moderna Sars-Covid-2 Vaccination 09/24/2019, 10/27/2019, 06/17/2020   PPD Test 12/11/2017   Tdap 02/24/2013   Zoster Recombinant(Shingrix ) 06/20/2023   Zoster, Live 08/15/2023     Objective: Vital Signs: BP 133/89 (BP Location: Left Arm, Patient Position: Sitting, Cuff Size: Normal)   Pulse 83   Resp 14   Ht 5' 2 (1.575 m)    Wt 138 lb (62.6 kg)   BMI 25.24 kg/m    Physical Exam Vitals and nursing note reviewed.  Constitutional:      Appearance: She is well-developed.  HENT:     Head: Normocephalic and atraumatic.  Eyes:     Conjunctiva/sclera: Conjunctivae normal.  Cardiovascular:     Rate and Rhythm: Normal rate and regular rhythm.     Heart sounds: Normal heart sounds.  Pulmonary:     Effort: Pulmonary effort is normal.     Breath sounds: Normal breath sounds.  Abdominal:     General: Bowel sounds are normal.     Palpations: Abdomen is soft.  Musculoskeletal:     Cervical back: Normal range of motion.  Lymphadenopathy:     Cervical: No cervical adenopathy.  Skin:    General: Skin is warm and dry.     Capillary Refill: Capillary refill takes less than 2 seconds.  Neurological:     Mental Status: She is alert and oriented to person, place, and time.  Psychiatric:        Behavior: Behavior normal.      Musculoskeletal Exam: She had good range of motion of the cervical thoracic and lumbar spine.  Shoulder joints were in good range of motion with minimal discomfort.  Elbow joints, wrist joints, MCPs PIPs and DIPs Juengel range of motion.  She had no synovitis on examination.  Hip joints and knee joints were in good range of motion without any warmth swelling or effusion.  There was no tenderness over ankles or MTPs.  CDAI Exam: CDAI Score: -- Patient Global: --; Provider Global: -- Swollen: --; Tender: -- Joint Exam 08/22/2023   No joint exam has been documented for this visit   There is currently no information documented on the homunculus. Go to the Rheumatology activity and complete the homunculus joint exam.  Investigation: No additional findings.  Imaging: No results found.   Recent Labs: Lab Results  Component Value Date   WBC 8.5 02/19/2023   HGB 13.2 02/19/2023   PLT 308 02/19/2023   NA 141 02/19/2023   K 4.2 02/19/2023   CL 102 02/19/2023   CO2 30 02/19/2023    GLUCOSE 84 02/19/2023   BUN 12 02/19/2023   CREATININE 0.61 02/19/2023   BILITOT 0.5 02/19/2023   ALKPHOS 74 03/04/2019   AST 15 02/19/2023   ALT 16 02/19/2023   PROT 7.9 02/19/2023   ALBUMIN 4.0 03/04/2019   CALCIUM 10.0 02/19/2023   GFRAA 127 06/02/2020   February 19, 2023 ANA 1: 80 cytoplasmic, dsDNA negative, ESR 39, C3-C4 normal June 03, 2023 hemoglobin A1c 5.8, LDL 130  Speciality Comments: No specialty comments available.  Procedures:  No procedures performed Allergies: Patient has no known allergies.   Assessment / Plan:     Visit Diagnoses: Chronic pain of both shoulders-she continues to have some stiffness in her shoulders.  She was evaluated by Ortho care in the past.  She is off NSAIDs now.  She has some stiffness in her shoulders.  She  does not want to go for physical therapy.  She states she started going to the gym.  I gave her a handout on shoulder exercises.  Arthritis of carpometacarpal (CMC) joint of left thumb-joint protection was discussed.  Chondromalacia of both patellae-she has off-and-on discomfort in her knee joints.  No warmth swelling or effusion was noted.  DDD (degenerative disc disease), cervical-she had good range of motion without any radiculopathy.  DDD (degenerative disc disease), thoracic-she has off-and-on discomfort in the thoracic spine.  She had no point tenderness on the examination and had good mobility.  Positive ANA (antinuclear antibody) -she had positive ANA and low titer double-stranded DNA in the past.  Plan: Protein / creatinine ratio, urine, CBC with Differential/Platelet, COMPLETE METABOLIC PANEL WITH GFR, Anti-DNA antibody, double-stranded, C3 and C4, Sedimentation rate, ANA  [name] without gangrene-she had good capillary refill with no nailbed capillary changes.  No sclerodactyly was noted.  Trapezius muscle spasm-she had mild trapezius spasm.  Range of motion exercises were discussed.  Fibromyalgia-she continues  to have some generalized discomfort.  Need for regular exercise and stretching was discussed.  Other insomnia-good sleep hygiene was discussed.  Vitamin D  deficiency-she asked vitamin D  supplement.  Family history of rheumatoid arthritis  Orders: Orders Placed This Encounter  Procedures   Protein / creatinine ratio, urine   CBC with Differential/Platelet   COMPLETE METABOLIC PANEL WITH GFR   Anti-DNA antibody, double-stranded   C3 and C4   Sedimentation rate   ANA   No orders of the defined types were placed in this encounter.    Follow-Up Instructions: Return in about 6 months (around 02/19/2024) for Osteoarthritis.   Maya Nash, MD  Note - This record has been created using Animal nutritionist.  Chart creation errors have been sought, but may not always  have been located. Such creation errors do not reflect on  the standard of medical care.

## 2023-08-15 ENCOUNTER — Ambulatory Visit: Payer: Managed Care, Other (non HMO)

## 2023-08-15 DIAGNOSIS — Z23 Encounter for immunization: Secondary | ICD-10-CM

## 2023-08-15 NOTE — Progress Notes (Signed)
 Patient seen in office today for second shingles dose. Vaccine given in left arm and patient tolerated injection well today.

## 2023-08-22 ENCOUNTER — Ambulatory Visit: Payer: Managed Care, Other (non HMO) | Attending: Rheumatology | Admitting: Rheumatology

## 2023-08-22 ENCOUNTER — Encounter: Payer: Self-pay | Admitting: Rheumatology

## 2023-08-22 VITALS — BP 133/89 | HR 83 | Resp 14 | Ht 62.0 in | Wt 138.0 lb

## 2023-08-22 DIAGNOSIS — G4709 Other insomnia: Secondary | ICD-10-CM

## 2023-08-22 DIAGNOSIS — M2241 Chondromalacia patellae, right knee: Secondary | ICD-10-CM | POA: Diagnosis not present

## 2023-08-22 DIAGNOSIS — M25511 Pain in right shoulder: Secondary | ICD-10-CM

## 2023-08-22 DIAGNOSIS — M797 Fibromyalgia: Secondary | ICD-10-CM

## 2023-08-22 DIAGNOSIS — E559 Vitamin D deficiency, unspecified: Secondary | ICD-10-CM

## 2023-08-22 DIAGNOSIS — M25512 Pain in left shoulder: Secondary | ICD-10-CM

## 2023-08-22 DIAGNOSIS — M1812 Unilateral primary osteoarthritis of first carpometacarpal joint, left hand: Secondary | ICD-10-CM | POA: Diagnosis not present

## 2023-08-22 DIAGNOSIS — M62838 Other muscle spasm: Secondary | ICD-10-CM

## 2023-08-22 DIAGNOSIS — I7300 Raynaud's syndrome without gangrene: Secondary | ICD-10-CM

## 2023-08-22 DIAGNOSIS — M5134 Other intervertebral disc degeneration, thoracic region: Secondary | ICD-10-CM

## 2023-08-22 DIAGNOSIS — Z8261 Family history of arthritis: Secondary | ICD-10-CM

## 2023-08-22 DIAGNOSIS — R768 Other specified abnormal immunological findings in serum: Secondary | ICD-10-CM

## 2023-08-22 DIAGNOSIS — M5030 Other cervical disc degeneration, unspecified cervical region: Secondary | ICD-10-CM

## 2023-08-22 DIAGNOSIS — G8929 Other chronic pain: Secondary | ICD-10-CM

## 2023-08-22 DIAGNOSIS — M2242 Chondromalacia patellae, left knee: Secondary | ICD-10-CM

## 2023-08-22 NOTE — Patient Instructions (Signed)

## 2023-09-16 ENCOUNTER — Telehealth: Payer: Self-pay | Admitting: Rheumatology

## 2023-09-16 DIAGNOSIS — M79601 Pain in right arm: Secondary | ICD-10-CM

## 2023-09-16 DIAGNOSIS — G8929 Other chronic pain: Secondary | ICD-10-CM

## 2023-09-16 NOTE — Telephone Encounter (Signed)
 yes

## 2023-09-16 NOTE — Telephone Encounter (Signed)
 Referral placed.

## 2023-09-16 NOTE — Telephone Encounter (Signed)
Patient contacted the office requesting a referral to Physical Therapy.  Patient request referral for arm and shoulders pain.  Patient's preferred area for referral is: [facility]

## 2023-10-04 ENCOUNTER — Ambulatory Visit: Payer: Managed Care, Other (non HMO)

## 2023-10-08 ENCOUNTER — Ambulatory Visit: Payer: Managed Care, Other (non HMO) | Attending: Rheumatology | Admitting: Physical Therapy

## 2023-10-08 ENCOUNTER — Encounter: Payer: Self-pay | Admitting: Physical Therapy

## 2023-10-08 ENCOUNTER — Other Ambulatory Visit: Payer: Self-pay

## 2023-10-08 DIAGNOSIS — M25511 Pain in right shoulder: Secondary | ICD-10-CM | POA: Diagnosis not present

## 2023-10-08 DIAGNOSIS — M25512 Pain in left shoulder: Secondary | ICD-10-CM | POA: Insufficient documentation

## 2023-10-08 DIAGNOSIS — M542 Cervicalgia: Secondary | ICD-10-CM | POA: Diagnosis present

## 2023-10-08 DIAGNOSIS — M79602 Pain in left arm: Secondary | ICD-10-CM | POA: Diagnosis not present

## 2023-10-08 DIAGNOSIS — M6281 Muscle weakness (generalized): Secondary | ICD-10-CM | POA: Diagnosis present

## 2023-10-08 DIAGNOSIS — G8929 Other chronic pain: Secondary | ICD-10-CM | POA: Diagnosis not present

## 2023-10-08 DIAGNOSIS — M79601 Pain in right arm: Secondary | ICD-10-CM | POA: Insufficient documentation
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
## 2023-11-01 ENCOUNTER — Ambulatory Visit: Payer: Managed Care, Other (non HMO) | Admitting: Physical Therapy

## 2023-12-06 NOTE — Progress Notes (Signed)
 Office Visit Note  Patient: Kimberly Horne             Date of Birth: April 21, 1971           MRN: [id number]             PCP: Adelia Homestead, MD Referring: Adelia Homestead, * Visit Date: 12/09/2023 Occupation: @GUAROCC @  Subjective:  Pain in both hands  History of Present Illness: Kimberly Horne is a 53 y.o. female with osteoarthritis, degenerative disc disease and fibromyalgia syndrome.  She returns today after her last visit in January.  She states for the last show 1 month she has been having increased pain and discomfort in her bilateral hands.  She states she goes to the gym and works out on a regular basis.  She has been lifting 10 pounds during the exercise class.  She states she has occasional discomfort in the middle of the night.  She has some discomfort in her knee joints when she is standing up from sitting position.  None of the other joints are painful or swollen.  He still gets intermittent Raynauds with the weather change.    Activities of Daily Living:  Patient reports morning stiffness for 10 minutes.   Patient Reports nocturnal pain.  Difficulty dressing/grooming: Denies Difficulty climbing stairs: Denies Difficulty getting out of chair: Denies Difficulty using hands for taps, buttons, cutlery, and/or writing: Denies  Review of Systems  Constitutional:  Negative for fatigue.  HENT:  Negative for mouth sores and mouth dryness.   Eyes:  Negative for pain and dryness.  Respiratory:  Negative for shortness of breath.   Cardiovascular:  Negative for chest pain and palpitations.  Gastrointestinal:  Negative for blood in stool, constipation and diarrhea.  Endocrine: Negative for increased urination.  Genitourinary:  Negative for involuntary urination.  Musculoskeletal:  Positive for joint pain, joint pain, joint swelling, muscle weakness and morning stiffness. Negative for gait problem, myalgias, muscle tenderness and myalgias.  Skin:   Positive for color change. Negative for rash, hair loss and sensitivity to sunlight.  Allergic/Immunologic: Negative for susceptible to infections.  Neurological:  Positive for numbness. Negative for dizziness and headaches.  Hematological:  Negative for swollen glands.  Psychiatric/Behavioral:  Negative for depressed mood and sleep disturbance. The patient is not nervous/anxious.     PMFS History:  Patient Active Problem List   Diagnosis Date Noted   Inflammatory pain of right heel 07/16/2023   Pain in right shoulder 11/01/2022   [name] without gangrene 06/15/2021   Arthritis of carpometacarpal [facility]) joint of left thumb 06/15/2021   Fibromyalgia 06/15/2021   Vitamin D  deficiency 06/02/2020   Routine general medical examination at a health care facility 01/24/2016    Past Medical History:  Diagnosis Date   Osteoarthritis    Scoliosis     Family History  Problem Relation Age of Onset   Hyperlipidemia Mother    Hypertension Mother    Dementia Mother    Diabetes Father    Alzheimer's disease Father    Healthy Sister    Healthy Brother    Healthy Brother    Healthy Daughter    Healthy Daughter    Past Surgical History:  Procedure Laterality Date   CHOLECYSTECTOMY  2002   COCCYX REMOVAL  1997   HAND SURGERY Right    cyst removed   Social History   Social History Narrative   Not on file   Immunization History  Administered Date(s) Administered  Influenza Inj Mdck Quad Pf 05/01/2019   Influenza, Seasonal, Injecte, Preservative Fre 06/03/2023   Influenza-Unspecified 04/24/2019, 06/29/2021   Moderna SARS-COV2 Booster Vaccination 06/17/2020   Moderna Sars-Covid-2 Vaccination 09/24/2019, 10/27/2019, 06/17/2020   PPD Test 12/11/2017   Tdap 02/24/2013   Zoster Recombinant(Shingrix ) 06/20/2023   Zoster, Live 08/15/2023     Objective: Vital Signs: BP 120/81 (BP Location: Left Arm, Patient Position: Sitting, Cuff Size: Normal)   Pulse 70   Resp 13   Ht  5\' 1"  (1.549 m)   Wt 139 lb 3.2 oz (63.1 kg)   BMI 26.30 kg/m    Physical Exam Vitals and nursing note reviewed.  Constitutional:      Appearance: She is well-developed.  HENT:     Head: Normocephalic and atraumatic.  Eyes:     Conjunctiva/sclera: Conjunctivae normal.  Cardiovascular:     Rate and Rhythm: Normal rate and regular rhythm.     Heart sounds: Normal heart sounds.  Pulmonary:     Effort: Pulmonary effort is normal.     Breath sounds: Normal breath sounds.  Abdominal:     General: Bowel sounds are normal.     Palpations: Abdomen is soft.  Musculoskeletal:     Cervical back: Normal range of motion.  Lymphadenopathy:     Cervical: No cervical adenopathy.  Skin:    General: Skin is warm and dry.     Capillary Refill: Capillary refill takes less than 2 seconds.  Neurological:     Mental Status: She is alert and oriented to person, place, and time.  Psychiatric:        Behavior: Behavior normal.      Musculoskeletal Exam: Patient had good range of motion of the cervical, thoracic and lumbar spine.  Shoulders were in good range of motion with some discomfort on range of motion of the left shoulder joint.  Elbow joints, wrist joints, MCPs, PIPs and DIPs were in good range of motion with no synovitis on the examination.  Phalen's and Tinel's test were negative.  Hip joints and knee joints in good range of motion without any warmth swelling or effusion.  There was no tenderness over ankles or MTPs.  CDAI Exam: CDAI Score: -- Patient Global: --; Provider Global: -- Swollen: --; Tender: -- Joint Exam 12/09/2023   No joint exam has been documented for this visit   There is currently no information documented on the homunculus. Go to the Rheumatology activity and complete the homunculus joint exam.  Investigation: No additional findings.  Imaging: No results found.  Recent Labs: Lab Results  Component Value Date   WBC 8.5 02/19/2023   HGB 13.2 02/19/2023   PLT  308 02/19/2023   NA 141 02/19/2023   K 4.2 02/19/2023   CL 102 02/19/2023   CO2 30 02/19/2023   GLUCOSE 84 02/19/2023   BUN 12 02/19/2023   CREATININE 0.61 02/19/2023   BILITOT 0.5 02/19/2023   ALKPHOS 74 03/04/2019   AST 15 02/19/2023   ALT 16 02/19/2023   PROT 7.9 02/19/2023   ALBUMIN 4.0 03/04/2019   CALCIUM 10.0 02/19/2023   GFRAA 127 06/02/2020    Speciality Comments: No specialty comments available.  Procedures:  No procedures performed Allergies: Patient has no known allergies.   Assessment / Plan:     Visit Diagnoses: Pain in both hands-she complains of pain and discomfort in her bilateral hands.  She relates the hand pain to lifting weights.  She states she has been lifting weights in the  exercise class.  She notices discomfort in her hands and sometimes pain in the middle of the night.  No synovitis was noted on the examination.  She did not have any tenderness over MCPs, PIPs or DIPs.  Mild CMC prominence was noted.  Joint protection muscle strengthening was discussed.  A handout on hand exercises was given.  I also advised her to lower the amount of weight she is using and use padded clips for lifting weights.  Patient was advised to contact us  if her symptoms get worse.  I discussed possibility of doing ultrasound to look for synovitis if her symptoms get worse.  Arthritis of carpometacarpal (CMC) joint of left thumb-she has some CMC prominence which was not tender on the examination today.  Chronic pain of both shoulders-she continues to have some stiffness and discomfort in her shoulders.  No synovitis was noted.  A handout on shoulder joint exercises was also provided.  Chondromalacia of both patellae-no warmth swelling or effusion was noted.  She continues to have some discomfort when she gets up from the sitting position.  DDD (degenerative disc disease), cervical she denies any discomfort today.  DDD (degenerative disc disease), thoracic-she has had discomfort in  the past.  She had good range of motion without any point tenderness.  Positive ANA (antinuclear antibody) - she had positive ANA and low titer double-stranded DNA in the past.  Serology was negative in July 2024 except for low titer positive ANA.  Recheck labs today.  She denies history for ulcers, nasal ulcers, malar rash, photosensitivity, lymphadenopathy.  She gives history of mild Raynaud's symptoms.  She had good capillary refill without any nailbed capillary changes.  [name] without gangrene she has intermittent symptoms in the colder temperatures.  Being cold temperature warm and warm clothing was discussed.  Trapezius muscle spasm-she continues to have some discomfort in the trapezius region.  Fibromyalgia she has generalized pain and discomfort from fibromyalgia.  Other insomnia-good sleep hygiene was discussed.  Vitamin D  deficiency-she stopped taking vitamin D .  Her vitamin D  has been consistently low in the past.  I advised her to resume vitamin D  2000 units daily.  I will check vitamin D  level today.  Family history of rheumatoid arthritis  Orders: Orders Placed This Encounter  Procedures   Protein / creatinine ratio, urine   CBC with Differential/Platelet   Comprehensive metabolic panel with GFR   Anti-DNA antibody, double-stranded   C3 and C4   Sedimentation rate   ANA   VITAMIN D  25 Hydroxy (Vit-D Deficiency, Fractures)   No orders of the defined types were placed in this encounter.    Follow-Up Instructions: Return in about 6 months (around 06/09/2024) for Osteoarthritis.   Nicholas Bari, MD  Note - This record has been created using Animal nutritionist.  Chart creation errors have been sought, but may not always  have been located. Such creation errors do not reflect on  the standard of medical care.

## 2023-12-09 ENCOUNTER — Ambulatory Visit: Attending: Rheumatology | Admitting: Rheumatology

## 2023-12-09 ENCOUNTER — Encounter: Payer: Self-pay | Admitting: Rheumatology

## 2023-12-09 VITALS — BP 120/81 | HR 70 | Resp 13 | Ht 61.0 in | Wt 139.2 lb

## 2023-12-09 DIAGNOSIS — M2242 Chondromalacia patellae, left knee: Secondary | ICD-10-CM

## 2023-12-09 DIAGNOSIS — M1812 Unilateral primary osteoarthritis of first carpometacarpal joint, left hand: Secondary | ICD-10-CM | POA: Diagnosis not present

## 2023-12-09 DIAGNOSIS — M79641 Pain in right hand: Secondary | ICD-10-CM

## 2023-12-09 DIAGNOSIS — G4709 Other insomnia: Secondary | ICD-10-CM

## 2023-12-09 DIAGNOSIS — R768 Other specified abnormal immunological findings in serum: Secondary | ICD-10-CM

## 2023-12-09 DIAGNOSIS — M2241 Chondromalacia patellae, right knee: Secondary | ICD-10-CM

## 2023-12-09 DIAGNOSIS — M79642 Pain in left hand: Secondary | ICD-10-CM

## 2023-12-09 DIAGNOSIS — M25511 Pain in right shoulder: Secondary | ICD-10-CM

## 2023-12-09 DIAGNOSIS — M25512 Pain in left shoulder: Secondary | ICD-10-CM

## 2023-12-09 DIAGNOSIS — M5134 Other intervertebral disc degeneration, thoracic region: Secondary | ICD-10-CM

## 2023-12-09 DIAGNOSIS — M62838 Other muscle spasm: Secondary | ICD-10-CM

## 2023-12-09 DIAGNOSIS — M5030 Other cervical disc degeneration, unspecified cervical region: Secondary | ICD-10-CM

## 2023-12-09 DIAGNOSIS — Z8261 Family history of arthritis: Secondary | ICD-10-CM

## 2023-12-09 DIAGNOSIS — G8929 Other chronic pain: Secondary | ICD-10-CM

## 2023-12-09 DIAGNOSIS — I7300 Raynaud's syndrome without gangrene: Secondary | ICD-10-CM

## 2023-12-09 DIAGNOSIS — M797 Fibromyalgia: Secondary | ICD-10-CM

## 2023-12-09 DIAGNOSIS — E559 Vitamin D deficiency, unspecified: Secondary | ICD-10-CM

## 2023-12-09 NOTE — Patient Instructions (Signed)
Hand Exercises Hand exercises can be helpful for almost anyone. They can strengthen your hands and improve flexibility and movement. The exercises can also increase blood flow to the hands. These results can make your work and daily tasks easier for you. Hand exercises can be especially helpful for people who have joint pain from arthritis or nerve damage from using their hands over and over. These exercises can also help people who injure a hand. Exercises Most of these hand exercises are gentle stretching and motion exercises. It is usually safe to do them often throughout the day. Warming up your hands before exercise may help reduce stiffness. You can do this with gentle massage or by placing your hands in warm water for 10-15 minutes. It is normal to feel some stretching, pulling, tightness, or mild discomfort when you begin new exercises. In time, this will improve. Remember to always be careful and stop right away if you feel sudden, very bad pain or your pain gets worse. You want to get better and be safe. Ask your health care provider which exercises are safe for you. Do exercises exactly as told by your provider and adjust them as told. Do not begin these exercises until told by your provider. Knuckle bend or "claw" fist  Stand or sit with your arm, hand, and all five fingers pointed straight up. Make sure to keep your wrist straight. Gently bend your fingers down toward your palm until the tips of your fingers are touching your palm. Keep your big knuckle straight and only bend the small knuckles in your fingers. Hold this position for 10 seconds. Straighten your fingers back to your starting position. Repeat this exercise 5-10 times with each hand. Full finger fist  Stand or sit with your arm, hand, and all five fingers pointed straight up. Make sure to keep your wrist straight. Gently bend your fingers into your palm until the tips of your fingers are touching the middle of your  palm. Hold this position for 10 seconds. Extend your fingers back to your starting position, stretching every joint fully. Repeat this exercise 5-10 times with each hand. Straight fist  Stand or sit with your arm, hand, and all five fingers pointed straight up. Make sure to keep your wrist straight. Gently bend your fingers at the big knuckle, where your fingers meet your hand, and at the middle knuckle. Keep the knuckle at the tips of your fingers straight and try to touch the bottom of your palm. Hold this position for 10 seconds. Extend your fingers back to your starting position, stretching every joint fully. Repeat this exercise 5-10 times with each hand. Tabletop  Stand or sit with your arm, hand, and all five fingers pointed straight up. Make sure to keep your wrist straight. Gently bend your fingers at the big knuckle, where your fingers meet your hand, as far down as you can. Keep the small knuckles in your fingers straight. Think of forming a tabletop with your fingers. Hold this position for 10 seconds. Extend your fingers back to your starting position, stretching every joint fully. Repeat this exercise 5-10 times with each hand. Finger spread  Place your hand flat on a table with your palm facing down. Make sure your wrist stays straight. Spread your fingers and thumb apart from each other as far as you can until you feel a gentle stretch. Hold this position for 10 seconds. Bring your fingers and thumb tight together again. Hold this position for 10 seconds. Repeat  this exercise 5-10 times with each hand. Making circles  Stand or sit with your arm, hand, and all five fingers pointed straight up. Make sure to keep your wrist straight. Make a circle by touching the tip of your thumb to the tip of your index finger. Hold for 10 seconds. Then open your hand wide. Repeat this motion with your thumb and each of your fingers. Repeat this exercise 5-10 times with each hand. Thumb  motion  Sit with your forearm resting on a table and your wrist straight. Your thumb should be facing up toward the ceiling. Keep your fingers relaxed as you move your thumb. Lift your thumb up as high as you can toward the ceiling. Hold for 10 seconds. Bend your thumb across your palm as far as you can, reaching the tip of your thumb for the small finger (pinkie) side of your palm. Hold for 10 seconds. Repeat this exercise 5-10 times with each hand. Grip strengthening  Hold a stress ball or other soft ball in the middle of your hand. Slowly increase the pressure, squeezing the ball as much as you can without causing pain. Think of bringing the tips of your fingers into the middle of your palm. All of your finger joints should bend when doing this exercise. Hold your squeeze for 10 seconds, then relax. Repeat this exercise 5-10 times with each hand. Contact a health care provider if: Your hand pain or discomfort gets much worse when you do an exercise. Your hand pain or discomfort does not improve within 2 hours after you exercise. If you have either of these problems, stop doing these exercises right away. Do not do them again unless your provider says that you can. Get help right away if: You develop sudden, severe hand pain or swelling. If this happens, stop doing these exercises right away. Do not do them again unless your provider says that you can. This information is not intended to replace advice given to you by your health care provider. Make sure you discuss any questions you have with your health care provider. Document Revised: 08/14/2022 Document Reviewed: 08/14/2022 Elsevier Patient Education  2024 [employer]  Shoulder Exercises Ask your health care provider which exercises are safe for you. Do exercises exactly as told by your health care provider and adjust them as directed. It is normal to feel mild stretching, pulling, tightness, or discomfort as you do these exercises.  Stop right away if you feel sudden pain or your pain gets worse. Do not begin these exercises until told by your health care provider. Stretching exercises External rotation and abduction This exercise is sometimes called corner stretch. The exercise rotates your arm outward (external rotation) and moves your arm out from your body (abduction). Stand in a doorway with one of your feet slightly in front of the other. This is called a staggered stance. If you cannot reach your forearms to the door frame, stand facing a corner of a room. Choose one of the following positions as told by your health care provider: Place your hands and forearms on the door frame above your head. Place your hands and forearms on the door frame at the height of your head. Place your hands on the door frame at the height of your elbows. Slowly move your weight onto your front foot until you feel a stretch across your chest and in the front of your shoulders. Keep your head and chest upright and keep your abdominal muscles tight. Hold for  __________ seconds. To release the stretch, shift your weight to your back foot. Repeat __________ times. Complete this exercise __________ times a day. Extension, standing  Stand and hold a broomstick, a cane, or a similar object behind your back. Your hands should be a little wider than shoulder-width apart. Your palms should face away from your back. Keeping your elbows straight and your shoulder muscles relaxed, move the stick away from your body until you feel a stretch in your shoulders (extension). Avoid shrugging your shoulders while you move the stick. Keep your shoulder blades tucked down toward the middle of your back. Hold for __________ seconds. Slowly return to the starting position. Repeat __________ times. Complete this exercise __________ times a day. Range-of-motion exercises Pendulum  Stand near a wall or a surface that you can hold onto for balance. Bend at the  waist and let your left / right arm hang straight down. Use your other arm to support you. Keep your back straight and do not lock your knees. Relax your left / right arm and shoulder muscles, and move your hips and your trunk so your left / right arm swings freely. Your arm should swing because of the motion of your body, not because you are using your arm or shoulder muscles. Keep moving your hips and trunk so your arm swings in the following directions, as told by your health care provider: Side to side. Forward and backward. In clockwise and counterclockwise circles. Continue each motion for __________ seconds, or for as long as told by your health care provider. Slowly return to the starting position. Repeat __________ times. Complete this exercise __________ times a day. Shoulder flexion, standing  Stand and hold a broomstick, a cane, or a similar object. Place your hands a little more than shoulder-width apart on the object. Your left / right hand should be palm-up, and your other hand should be palm-down. Keep your elbow straight and your shoulder muscles relaxed. Push the stick up with your healthy arm to raise your left / right arm in front of your body, and then over your head until you feel a stretch in your shoulder (flexion). Avoid shrugging your shoulder while you raise your arm. Keep your shoulder blade tucked down toward the middle of your back. Hold for __________ seconds. Slowly return to the starting position. Repeat __________ times. Complete this exercise __________ times a day. Shoulder abduction, standing  Stand and hold a broomstick, a cane, or a similar object. Place your hands a little more than shoulder-width apart on the object. Your left / right hand should be palm-up, and your other hand should be palm-down. Keep your elbow straight and your shoulder muscles relaxed. Push the object across your body toward your left / right side. Raise your left / right arm to the  side of your body (abduction) until you feel a stretch in your shoulder. Do not raise your arm above shoulder height unless your health care provider tells you to do that. If directed, raise your arm over your head. Avoid shrugging your shoulder while you raise your arm. Keep your shoulder blade tucked down toward the middle of your back. Hold for __________ seconds. Slowly return to the starting position. Repeat __________ times. Complete this exercise __________ times a day. Internal rotation  Place your left / right hand behind your back, palm-up. Use your other hand to dangle an exercise band, a broomstick, or a similar object over your shoulder. Grasp the band with your left /  right hand so you are holding on to both ends. Gently pull up on the band until you feel a stretch in the front of your left / right shoulder. The movement of your arm toward the center of your body is called internal rotation. Avoid shrugging your shoulder while you raise your arm. Keep your shoulder blade tucked down toward the middle of your back. Hold for __________ seconds. Release the stretch by letting go of the band and lowering your hands. Repeat __________ times. Complete this exercise __________ times a day. Strengthening exercises External rotation  Sit in a stable chair without armrests. Secure an exercise band to a stable object at elbow height on your left / right side. Place a soft object, such as a folded towel or a small pillow, between your left / right upper arm and your body to move your elbow about 4 inches (10 cm) away from your side. Hold the end of the exercise band so it is tight and there is no slack. Keeping your elbow pressed against the soft object, slowly move your forearm out, away from your abdomen (external rotation). Keep your body steady so only your forearm moves. Hold for __________ seconds. Slowly return to the starting position. Repeat __________ times. Complete this  exercise __________ times a day. Shoulder abduction  Sit in a stable chair without armrests, or stand up. Hold a __________ lb / kg weight in your left / right hand, or hold an exercise band with both hands. Start with your arms straight down and your left / right palm facing in, toward your body. Slowly lift your left / right hand out to your side (abduction). Do not lift your hand above shoulder height unless your health care provider tells you that this is safe. Keep your arms straight. Avoid shrugging your shoulder while you do this movement. Keep your shoulder blade tucked down toward the middle of your back. Hold for __________ seconds. Slowly lower your arm, and return to the starting position. Repeat __________ times. Complete this exercise __________ times a day. Shoulder extension  Sit in a stable chair without armrests, or stand up. Secure an exercise band to a stable object in front of you so it is at shoulder height. Hold one end of the exercise band in each hand. Straighten your elbows and lift your hands up to shoulder height. Squeeze your shoulder blades together as you pull your hands down to the sides of your thighs (extension). Stop when your hands are straight down by your sides. Do not let your hands go behind your body. Hold for __________ seconds. Slowly return to the starting position. Repeat __________ times. Complete this exercise __________ times a day. Shoulder row  Sit in a stable chair without armrests, or stand up. Secure an exercise band to a stable object in front of you so it is at chest height. Hold one end of the exercise band in each hand. Position your palms so that your thumbs are facing the ceiling (neutral position). Bend each of your elbows to a 90-degree angle (right angle) and keep your upper arms at your sides. Step back or move the chair back until the band is tight and there is no slack. Slowly pull your elbows back behind you. Hold for  __________ seconds. Slowly return to the starting position. Repeat __________ times. Complete this exercise __________ times a day. Shoulder press-ups  Sit in a stable chair that has armrests. Sit upright, with your feet flat on the  floor. Put your hands on the armrests so your elbows are bent and your fingers are pointing forward. Your hands should be about even with the sides of your body. Push down on the armrests and use your arms to lift yourself off the chair. Straighten your elbows and lift yourself up as much as you comfortably can. Move your shoulder blades down, and avoid letting your shoulders move up toward your ears. Keep your feet on the ground. As you get stronger, your feet should support less of your body weight as you lift yourself up. Hold for __________ seconds. Slowly lower yourself back into the chair. Repeat __________ times. Complete this exercise __________ times a day. Wall push-ups  Stand so you are facing a stable wall. Your feet should be about one arm-length away from the wall. Lean forward and place your palms on the wall at shoulder height. Keep your feet flat on the floor as you bend your elbows and lean forward toward the wall. Hold for __________ seconds. Straighten your elbows to push yourself back to the starting position. Repeat __________ times. Complete this exercise __________ times a day. This information is not intended to replace advice given to you by your health care provider. Make sure you discuss any questions you have with your health care provider. Document Revised: 09/19/2021 Document Reviewed: 09/19/2021 Elsevier Patient Education  2024 [employer].

## 2023-12-11 LAB — COMPREHENSIVE METABOLIC PANEL WITH GFR
AG Ratio: 1.3 (calc) (ref 1.0–2.5)
ALT: 20 U/L (ref 6–29)
AST: 18 U/L (ref 10–35)
Albumin: 4.3 g/dL (ref 3.6–5.1)
Alkaline phosphatase (APISO): 95 U/L (ref 37–153)
BUN: 9 mg/dL (ref 7–25)
CO2: 30 mmol/L (ref 20–32)
Calcium: 9.4 mg/dL (ref 8.6–10.4)
Chloride: 102 mmol/L (ref 98–110)
Creat: 0.64 mg/dL (ref 0.50–1.03)
Globulin: 3.2 g/dL (ref 1.9–3.7)
Glucose, Bld: 94 mg/dL (ref 65–99)
Potassium: 4.4 mmol/L (ref 3.5–5.3)
Sodium: 140 mmol/L (ref 135–146)
Total Bilirubin: 0.4 mg/dL (ref 0.2–1.2)
Total Protein: 7.5 g/dL (ref 6.1–8.1)
eGFR: 106 mL/min/{1.73_m2} (ref >=60)

## 2023-12-11 LAB — CBC WITH DIFFERENTIAL/PLATELET
Absolute Lymphocytes: 1811 {cells}/uL (ref 850–3900)
Absolute Monocytes: 435 {cells}/uL (ref 200–950)
Basophils Absolute: 38 {cells}/uL (ref 0–200)
Basophils Relative: 0.6 %
Eosinophils Absolute: 198 {cells}/uL (ref 15–500)
Eosinophils Relative: 3.1 %
HCT: 39.8 % (ref 35.0–45.0)
Hemoglobin: 13 g/dL (ref 11.7–15.5)
MCH: 27.3 pg (ref 27.0–33.0)
MCHC: 32.7 g/dL (ref 32.0–36.0)
MCV: 83.6 fL (ref 80.0–100.0)
MPV: 9.5 fL (ref 7.5–12.5)
Monocytes Relative: 6.8 %
Neutro Abs: 3917 {cells}/uL (ref 1500–7800)
Neutrophils Relative %: 61.2 %
Platelets: 286 10*3/uL (ref 140–400)
RBC: 4.76 10*6/uL (ref 3.80–5.10)
RDW: 14.5 % (ref 11.0–15.0)
Total Lymphocyte: 28.3 %
WBC: 6.4 10*3/uL (ref 3.8–10.8)

## 2023-12-11 LAB — C3 AND C4
C3 Complement: 166 mg/dL (ref 83–193)
C4 Complement: 30 mg/dL (ref 15–57)

## 2023-12-11 LAB — ANTI-NUCLEAR AB-TITER (ANA TITER): ANA Titer 1: 1:80 {titer} — ABNORMAL HIGH

## 2023-12-11 LAB — PROTEIN / CREATININE RATIO, URINE
Creatinine, Urine: 20 mg/dL (ref 20–275)
Total Protein, Urine: <4 mg/dL — ABNORMAL LOW (ref 5–24)

## 2023-12-11 LAB — VITAMIN D 25 HYDROXY (VIT D DEFICIENCY, FRACTURES): Vit D, 25-Hydroxy: 43 ng/mL (ref 30–100)

## 2023-12-11 LAB — ANTI-DNA ANTIBODY, DOUBLE-STRANDED: ds DNA Ab: 3 [IU]/mL

## 2023-12-11 LAB — ANA: Anti Nuclear Antibody (ANA): POSITIVE — AB

## 2023-12-11 LAB — SEDIMENTATION RATE: Sed Rate: 38 mm/h — ABNORMAL HIGH (ref 0–30)

## 2023-12-11 NOTE — Progress Notes (Signed)
 Urine protein creatinine ratio normal, sed rate mildly elevated at 38 and stable, CBC and CMP are normal, double-stranded DNA negative, complements normal, vitamin D  normal.  ANA pending

## 2023-12-11 NOTE — Progress Notes (Signed)
 ANA is low titer and stable.  Labs do not indicate an autoimmune disease flare.

## 2024-02-19 ENCOUNTER — Ambulatory Visit: Payer: Managed Care, Other (non HMO) | Admitting: Rheumatology

## 2024-04-15 ENCOUNTER — Ambulatory Visit: Admitting: Physician Assistant

## 2024-04-15 ENCOUNTER — Other Ambulatory Visit: Payer: Self-pay

## 2024-04-15 ENCOUNTER — Encounter: Payer: Self-pay | Admitting: Physician Assistant

## 2024-04-15 DIAGNOSIS — M542 Cervicalgia: Secondary | ICD-10-CM

## 2024-04-15 DIAGNOSIS — G8929 Other chronic pain: Secondary | ICD-10-CM

## 2024-04-15 DIAGNOSIS — M25511 Pain in right shoulder: Secondary | ICD-10-CM | POA: Diagnosis not present

## 2024-04-15 MED ORDER — METHYLPREDNISOLONE 4 MG PO TBPK
ORAL_TABLET | ORAL | 0 refills | Status: DC
Start: 2024-04-15 — End: 2024-06-05

## 2024-04-15 NOTE — Progress Notes (Signed)
 Office Visit Note   Patient: Kimberly Horne           Date of Birth: 09-Nov-1970           MRN: [id number] Visit Date: 04/15/2024              Requested by: Rollene Almarie LABOR, MD [address] [place],  [place] [address] PCP: Rollene Almarie LABOR, MD   Assessment & Plan: Visit Diagnoses:  1. Neck pain   2. Chronic right shoulder pain     Plan: Patient is a pleasant and active 53 year old woman with a 3-week history of base of neck into the upper back and into the shoulder pain.  This was after moving a heavy table.  She said she has significant pain initially and burning.  This did get better but then unfortunately she had to take a 9-hour drive.  She also has a job and is studying and is working on a Anim[employer].  X-rays today demonstrate straightening of the normal lordotic curve with multiple level degenerative changes no acute changes are noted.  Findings today she has good strength I think consistent with cervical radiculopathy.  I think she do well with a course of steroid she is willing to do this I explained the side effects of medications not to take while taking this if she is not better we will follow-up  Follow-Up Instructions: No follow-ups on file.   Orders:  Orders Placed This Encounter  Procedures   XR Cervical Spine 2 or 3 views   No orders of the defined types were placed in this encounter.     Procedures: No procedures performed   Clinical Data: No additional findings.   Subjective: Chief Complaint  Patient presents with   Neck - Pain    Patient in today with pain in the neck and right shoulder for 3 weeks after moving a heavy table, she is taking no meds at this time,   limited ROM     HPI patient is a pleasant 53 year old woman who comes in with a chief complaint of neck and arm pain.  She said she was moving a heavy table 3 weeks ago and is having pain in her right neck shoulder area.  She has limited range of motion has not currently  taking any medication pain from the neck down denies any weakness  Review of Systems  All other systems reviewed and are negative.    Objective: Vital Signs: There were no vitals taken for this visit.  Physical Exam Constitutional:      Appearance: Normal appearance.  Pulmonary:     Effort: Pulmonary effort is normal.  Skin:    General: Skin is warm and dry.  Neurological:     General: No focal deficit present.     Mental Status: She is alert and oriented to person, place, and time.  Psychiatric:        Mood and Affect: Mood normal.        Behavior: Behavior normal.     Ortho Exam Examination she has increased pain with extension of her neck with slight decreased range of motion.  She has little limited range of motion of her arm but secondary to burning in her back.  She has good biceps triceps and abductor strength.  She has good grip strength.  Sensation is intact. Specialty Comments:  No specialty comments available.  Imaging: No results found.   PMFS History: Patient Active Problem List   Diagnosis  Date Noted   Inflammatory pain of right heel 07/16/2023   Pain in right shoulder 11/01/2022   [name] without gangrene 06/15/2021   Arthritis of carpometacarpal [facility]) joint of left thumb 06/15/2021   Fibromyalgia 06/15/2021   Vitamin D  deficiency 06/02/2020   Routine general medical examination at a health care facility 01/24/2016   Past Medical History:  Diagnosis Date   Osteoarthritis    Scoliosis     Family History  Problem Relation Age of Onset   Hyperlipidemia Mother    Hypertension Mother    Dementia Mother    Diabetes Father    Alzheimer's disease Father    Healthy Sister    Healthy Brother    Healthy Brother    Healthy Daughter    Healthy Daughter     Past Surgical History:  Procedure Laterality Date   CHOLECYSTECTOMY  2002   COCCYX REMOVAL  1997   HAND SURGERY Right    cyst removed   Social History   Occupational History    Not on file  Tobacco Use   Smoking status: Never    Passive exposure: Never   Smokeless tobacco: Never  Vaping Use   Vaping status: Never Used  Substance and Sexual Activity   Alcohol use: Not Currently   Drug use: Never   Sexual activity: Not on file

## 2024-05-04 LAB — HM PAP SMEAR
HM Pap smear: NEGATIVE
HPV, high-risk: NEGATIVE

## 2024-05-25 LAB — HM MAMMOGRAPHY

## 2024-06-04 ENCOUNTER — Encounter: Payer: Managed Care, Other (non HMO) | Admitting: Internal Medicine

## 2024-06-05 ENCOUNTER — Ambulatory Visit: Admitting: Internal Medicine

## 2024-06-05 ENCOUNTER — Encounter: Payer: Self-pay | Admitting: Internal Medicine

## 2024-06-05 VITALS — BP 120/80 | HR 64 | Temp 98.4°F | Ht 61.0 in | Wt 137.4 lb

## 2024-06-05 DIAGNOSIS — Z13220 Encounter for screening for lipoid disorders: Secondary | ICD-10-CM | POA: Diagnosis not present

## 2024-06-05 DIAGNOSIS — M797 Fibromyalgia: Secondary | ICD-10-CM

## 2024-06-05 DIAGNOSIS — R7303 Prediabetes: Secondary | ICD-10-CM

## 2024-06-05 DIAGNOSIS — Z0000 Encounter for general adult medical examination without abnormal findings: Secondary | ICD-10-CM | POA: Diagnosis not present

## 2024-06-05 LAB — HEMOGLOBIN A1C: Hgb A1c MFr Bld: 5.8 % (ref 4.6–6.5)

## 2024-06-05 LAB — LIPID PANEL
Cholesterol: 213 mg/dL — ABNORMAL HIGH (ref 0–200)
HDL: 51.7 mg/dL (ref >39.00)
LDL Cholesterol: 137 mg/dL — ABNORMAL HIGH (ref 0–99)
NonHDL: 161.74
Total CHOL/HDL Ratio: 4
Triglycerides: 122 mg/dL (ref 0.0–149.0)
VLDL: 24.4 mg/dL (ref 0.0–40.0)

## 2024-06-05 LAB — TSH: TSH: 3.07 u[IU]/mL (ref 0.35–5.50)

## 2024-06-05 NOTE — Patient Instructions (Signed)
Think about the pneumonia vaccine.

## 2024-06-05 NOTE — Assessment & Plan Note (Signed)
 Overall stable and more tension in the upper back and neck due to being on computer for school and work.

## 2024-06-05 NOTE — Assessment & Plan Note (Signed)
 Flu shot declines. Pneumonia declines. Shingrix  complete. Tetanus up to date. Colonoscopy up to date. Mammogram getting records, pap smear getting records. Counseled about sun safety and mole surveillance. Counseled about the dangers of distracted driving. Given 10 year screening recommendations.

## 2024-06-05 NOTE — Progress Notes (Signed)
   Subjective:   Patient ID: Kimberly Horne, female    DOB: [birth year], 53 y.o.   MRN: [id number]  The patient is here for physical. Pertinent topics discussed: Discussed the use of AI scribe software for clinical note transcription with the patient, who gave verbal consent to proceed. History of Present Illness Kimberly Horne is a 53 year old female who presents with musculoskeletal pain and dietary concerns.  She experiences musculoskeletal pain, particularly in her neck and shoulders. She reports spending a lot of time on the computer and has scoliosis. The pain sometimes radiates down her leg and affects her sleep, as she often wakes up in pain. She does not regularly take medication for the pain, preferring to use hot showers and walking for relief. She had a significant episode of cramps in her toes about three weeks ago, which resolved on her own. She has started taking magnesium capsules as a precaution.  PMH, [facility], social history reviewed and updated  Review of Systems  Constitutional: Negative.   HENT: Negative.    Eyes: Negative.   Respiratory:  Negative for cough, chest tightness and shortness of breath.   Cardiovascular:  Negative for chest pain, palpitations and leg swelling.  Gastrointestinal:  Negative for abdominal distention, abdominal pain, constipation, diarrhea, nausea and vomiting.  Musculoskeletal:  Positive for arthralgias and myalgias.  Skin: Negative.   Neurological: Negative.   Psychiatric/Behavioral: Negative.      Objective:  Physical Exam Constitutional:      Appearance: She is well-developed.  HENT:     Head: Normocephalic and atraumatic.  Cardiovascular:     Rate and Rhythm: Normal rate and regular rhythm.  Pulmonary:     Effort: Pulmonary effort is normal. No respiratory distress.     Breath sounds: Normal breath sounds. No wheezing or rales.  Abdominal:     General: Bowel sounds are normal. There is no distension.     Palpations:  Abdomen is soft.     Tenderness: There is no abdominal tenderness.  Musculoskeletal:     Cervical back: Normal range of motion.  Skin:    General: Skin is warm and dry.  Neurological:     Mental Status: She is alert and oriented to person, place, and time.     Coordination: Coordination normal.     Vitals:   06/05/24 0920  BP: 120/80  Pulse: 64  Temp: 98.4 F (36.9 C)  TempSrc: Oral  SpO2: 99%  Weight: 137 lb 6.4 oz (62.3 kg)  Height: 5' 1 (1.549 m)    Assessment & Plan:

## 2024-06-05 NOTE — Assessment & Plan Note (Signed)
 Checking HgA1c and adjust as needed.

## 2024-06-08 ENCOUNTER — Ambulatory Visit: Payer: Self-pay | Admitting: Internal Medicine

## 2024-06-08 NOTE — Progress Notes (Signed)
 Office Visit Note  Patient: Kimberly Horne             Date of Birth: [birth year]           MRN: [id number]             PCP: Rollene Almarie LABOR, MD Referring: Rollene Almarie LABOR, * Visit Date: 06/22/2024 Occupation: Data Unavailable  Subjective:  Muscle cramps  History of Present Illness: Kimberly Horne is a 53 y.o. female with osteoarthritis, degenerative disc disease, Raynaud's and fibromyalgia syndrome.  She returns today after her last visit in April 2025.  She states recently she has been experiencing increased muscle cramps in her lower extremities.  She has tried  magnesium supplement.  She has been experiencing pain and discomfort over the left first MTP and her heels specially at nighttime.  Has intermittent discomfort in her right shoulder joint in her hands.  She states her neck wrist is stiff because she is studying a lot is on computer and reading books.  She does not have much upper back pain.  She has some generalized pain and discomfort from fibromyalgia.  She continues to have some insomnia.      Activities of Daily Living:  Patient reports morning stiffness for a few minutes.   Patient Reports nocturnal pain.  Difficulty dressing/grooming: Reports Difficulty climbing stairs: Denies Difficulty getting out of chair: Denies Difficulty using hands for taps, buttons, cutlery, and/or writing: Denies  Review of Systems  Constitutional:  Negative for fatigue.  HENT:  Negative for mouth sores and mouth dryness.   Eyes:  Negative for dryness.  Respiratory:  Negative for shortness of breath.   Cardiovascular:  Negative for chest pain and palpitations.  Gastrointestinal:  Negative for blood in stool, constipation and diarrhea.  Endocrine: Negative for increased urination.  Genitourinary:  Negative for involuntary urination.  Musculoskeletal:  Positive for joint pain, joint pain, myalgias, morning stiffness, muscle tenderness and myalgias. Negative for  gait problem, joint swelling and muscle weakness.  Skin:  Negative for color change, rash, hair loss and sensitivity to sunlight.  Allergic/Immunologic: Negative for susceptible to infections.  Neurological:  Negative for dizziness and headaches.  Hematological:  Negative for swollen glands.  Psychiatric/Behavioral:  Positive for sleep disturbance. Negative for depressed mood. The patient is not nervous/anxious.     PMFS History:  Patient Active Problem List   Diagnosis Date Noted   Pre-diabetes 06/05/2024   Inflammatory pain of right heel 07/16/2023   Pain in right shoulder 11/01/2022   [name] without gangrene 06/15/2021   Arthritis of carpometacarpal [facility]) joint of left thumb 06/15/2021   Fibromyalgia 06/15/2021   Vitamin D  deficiency 06/02/2020   Routine general medical examination at a health care facility 01/24/2016    Past Medical History:  Diagnosis Date   Osteoarthritis    Scoliosis     Family History  Problem Relation Age of Onset   Hyperlipidemia Mother    Hypertension Mother    Dementia Mother    Diabetes Father    Alzheimer's disease Father    Healthy Sister    Healthy Brother    Healthy Brother    Healthy Daughter    Healthy Daughter    Past Surgical History:  Procedure Laterality Date   CHOLECYSTECTOMY  2002   COCCYX REMOVAL  1997   HAND SURGERY Right    cyst removed   Social History   Tobacco Use   Smoking status: Never    Passive exposure: Never  Smokeless tobacco: Never  Vaping Use   Vaping status: Never Used  Substance Use Topics   Alcohol use: Not Currently   Drug use: Never   Social History   Social History Narrative   Not on file     Immunization History  Administered Date(s) Administered   Hep A, Unspecified 09/25/2002   Influenza Inj Mdck Quad Pf 05/01/2019   Influenza, Seasonal, Injecte, Preservative Fre 06/03/2023   Influenza-Unspecified 04/24/2019, 06/29/2021   MMR 04/13/1972, 10/16/1974   Moderna Covid-19  Vaccine Bivalent Booster 78yrs & up 07/01/2021   Moderna SARS-COV2 Booster Vaccination 06/17/2020   Moderna Sars-Covid-2 Vaccination 09/24/2019, 10/27/2019, 06/17/2020   PPD Test 12/11/2017   Tdap 02/24/2013, 11/20/2017   Zoster Recombinant(Shingrix ) 06/20/2023, 08/15/2023     Objective: Vital Signs: BP 129/88   Pulse 61   Temp 97.6 F (36.4 C)   Resp 14   Ht 5' 1 (1.549 m)   Wt 136 lb 6.4 oz (61.9 kg)   BMI 25.77 kg/m    Physical Exam Vitals and nursing note reviewed.  Constitutional:      Appearance: She is well-developed.  HENT:     Head: Normocephalic and atraumatic.  Eyes:     Conjunctiva/sclera: Conjunctivae normal.  Cardiovascular:     Rate and Rhythm: Normal rate and regular rhythm.     Heart sounds: Normal heart sounds.  Pulmonary:     Effort: Pulmonary effort is normal.     Breath sounds: Normal breath sounds.  Abdominal:     General: Bowel sounds are normal.     Palpations: Abdomen is soft.  Musculoskeletal:     Cervical back: Normal range of motion.  Lymphadenopathy:     Cervical: No cervical adenopathy.  Skin:    General: Skin is warm and dry.     Capillary Refill: Capillary refill takes less than 2 seconds.     Comments: She had good capillary refill with no nailbed capillary changes.  No telangiectasia or sclerodactyly was noted.  Neurological:     Mental Status: She is alert and oriented to person, place, and time.  Psychiatric:        Behavior: Behavior normal.      Musculoskeletal Exam: Cervical, thoracic and lumbar spine were in good range of motion.  She had bilateral trapezius spasm.  There was no SI joint tenderness.  Shoulder joints, elbow joints, wrist joints, MCPs, PIPs and DIPs were in good range of motion with no synovitis.  Hip joints and knee joints were in good range of motion without any warmth swelling or effusion.  There was no tenderness over ankles or MTPs.   CDAI Exam: CDAI Score: -- Patient Global: --; Provider Global:  -- Swollen: --; Tender: -- Joint Exam 06/22/2024   No joint exam has been documented for this visit   There is currently no information documented on the homunculus. Go to the Rheumatology activity and complete the homunculus joint exam.  Investigation: No additional findings.  Imaging: No results found.  Recent Labs: Lab Results  Component Value Date   WBC 6.4 12/09/2023   HGB 13.0 12/09/2023   PLT 286 12/09/2023   NA 140 12/09/2023   K 4.4 12/09/2023   CL 102 12/09/2023   CO2 30 12/09/2023   GLUCOSE 94 12/09/2023   BUN 9 12/09/2023   CREATININE 0.64 12/09/2023   BILITOT 0.4 12/09/2023   ALKPHOS 74 03/04/2019   AST 18 12/09/2023   ALT 20 12/09/2023   PROT 7.5 12/09/2023   ALBUMIN  4.0 03/04/2019   CALCIUM 9.4 12/09/2023   GFRAA 127 06/02/2020    Speciality Comments: No specialty comments available.  Procedures:  No procedures performed Allergies: Patient has no known allergies.   Assessment / Plan:     Visit Diagnoses: Pain in both hands-she continues to have some stiffness in her hands.  No synovitis was noted.  Arthritis of carpometacarpal (CMC) joint of left thumb-joint protection muscle strengthening was discussed.  Chronic pain of both shoulders-she continues to have intermittent discomfort in her shoulders.  She has shoulders been good range of motion without any tenderness.  Chondromalacia of both patellae-she is currently not having discomfort in her knee joints.  Bilateral bunions-she complains of discomfort over bunions especially her left foot.  Exercises for bunions were advised.  Toe separator was advised.  Trapezius muscle spasm-she continues to have neck discomfort due to trapezius muscle spasm.  A handout on neck exercises was given.  DDD (degenerative disc disease), cervical-a handout on neck exercises was given.  DDD (degenerative disc disease), thoracic-she had no thoracic spine tenderness.  Positive ANA (antinuclear antibody) - she had  positive ANA and low titer double-stranded DNA in the past.  Serology was negative in July 2024 except for low titer positive ANA.  She has no new symptoms.  We decided not to do any further labs unless she develops new symptoms.  [name] without gangrene-keeping core temperature warm and warm clothing was discussed.  No nailbed capillary changes was noted.  No sclerodactyly was noted.  No telangiectasia were noted.  I advised her to contact me if her symptoms get worse.  Fibromyalgia-she continues to have generalized pain and discomfort from fibromyalgia.  Need for regular exercise and stretching was discussed.  Other insomnia-sleep hygiene was discussed.  Vitamin D  deficiency  Family history of rheumatoid arthritis  Orders: No orders of the defined types were placed in this encounter.  No orders of the defined types were placed in this encounter.    Follow-Up Instructions: Return in about 1 year (around 06/22/2025) for Osteoarthritis.   Maya Nash, MD  Note - This record has been created using [employer].  Chart creation errors have been sought, but may not always  have been located. Such creation errors do not reflect on  the standard of medical care.

## 2024-06-15 ENCOUNTER — Encounter: Payer: Self-pay | Admitting: Radiology

## 2024-06-22 ENCOUNTER — Ambulatory Visit: Attending: Rheumatology | Admitting: Rheumatology

## 2024-06-22 ENCOUNTER — Encounter: Payer: Self-pay | Admitting: Rheumatology

## 2024-06-22 VITALS — BP 129/88 | HR 61 | Temp 97.6°F | Resp 14 | Ht 61.0 in | Wt 136.4 lb

## 2024-06-22 DIAGNOSIS — M25511 Pain in right shoulder: Secondary | ICD-10-CM

## 2024-06-22 DIAGNOSIS — M2242 Chondromalacia patellae, left knee: Secondary | ICD-10-CM

## 2024-06-22 DIAGNOSIS — M62838 Other muscle spasm: Secondary | ICD-10-CM

## 2024-06-22 DIAGNOSIS — M797 Fibromyalgia: Secondary | ICD-10-CM

## 2024-06-22 DIAGNOSIS — M79641 Pain in right hand: Secondary | ICD-10-CM

## 2024-06-22 DIAGNOSIS — M79642 Pain in left hand: Secondary | ICD-10-CM

## 2024-06-22 DIAGNOSIS — E559 Vitamin D deficiency, unspecified: Secondary | ICD-10-CM

## 2024-06-22 DIAGNOSIS — G8929 Other chronic pain: Secondary | ICD-10-CM

## 2024-06-22 DIAGNOSIS — M21611 Bunion of right foot: Secondary | ICD-10-CM

## 2024-06-22 DIAGNOSIS — M2241 Chondromalacia patellae, right knee: Secondary | ICD-10-CM | POA: Diagnosis not present

## 2024-06-22 DIAGNOSIS — Z8261 Family history of arthritis: Secondary | ICD-10-CM

## 2024-06-22 DIAGNOSIS — M21612 Bunion of left foot: Secondary | ICD-10-CM

## 2024-06-22 DIAGNOSIS — R7689 Other specified abnormal immunological findings in serum: Secondary | ICD-10-CM

## 2024-06-22 DIAGNOSIS — I7300 Raynaud's syndrome without gangrene: Secondary | ICD-10-CM

## 2024-06-22 DIAGNOSIS — M25512 Pain in left shoulder: Secondary | ICD-10-CM

## 2024-06-22 DIAGNOSIS — M5134 Other intervertebral disc degeneration, thoracic region: Secondary | ICD-10-CM

## 2024-06-22 DIAGNOSIS — G4709 Other insomnia: Secondary | ICD-10-CM

## 2024-06-22 DIAGNOSIS — M1812 Unilateral primary osteoarthritis of first carpometacarpal joint, left hand: Secondary | ICD-10-CM | POA: Diagnosis not present

## 2024-06-22 DIAGNOSIS — M5030 Other cervical disc degeneration, unspecified cervical region: Secondary | ICD-10-CM

## 2024-06-22 NOTE — Patient Instructions (Signed)
 Cervical Strain and Sprain Rehab Ask your health care provider which exercises are safe for you. Do exercises exactly as told by your health care provider and adjust them as directed. It is normal to feel mild stretching, pulling, tightness, or discomfort as you do these exercises. Stop right away if you feel sudden pain or your pain gets worse. Do not begin these exercises until told by your health care provider. Stretching and range-of-motion exercises Cervical side bending  Using good posture, sit on a stable chair or stand up. Without moving your shoulders, slowly tilt your left / right ear to your shoulder until you feel a stretch in the neck muscles on the opposite side. You should be looking straight ahead. Hold for __________ seconds. Repeat with the other side of your neck. Repeat __________ times. Complete this exercise __________ times a day. Cervical rotation  Using good posture, sit on a stable chair or stand up. Slowly turn your head to the side as if you are looking over your left / right shoulder. Keep your eyes level with the ground. Stop when you feel a stretch along the side and the back of your neck. Hold for __________ seconds. Repeat this by turning to your other side. Repeat __________ times. Complete this exercise __________ times a day. Thoracic extension and pectoral stretch  Roll a towel or a small blanket so it is about 4 inches (10 cm) in diameter. Lie down on your back on a firm surface. Put the towel in the middle of your back across your spine. It should not be under your shoulder blades. Put your hands behind your head and let your elbows fall out to your sides. Hold for __________ seconds. Repeat __________ times. Complete this exercise __________ times a day. Strengthening exercises Upper cervical flexion  Lie on your back with a thin pillow behind your head or a small, rolled-up towel under your neck. Gently tuck your chin toward your chest and nod  your head down to look toward your feet. Do not lift your head off the pillow. Hold for __________ seconds. Release the tension slowly. Relax your neck muscles completely before you repeat this exercise. Repeat __________ times. Complete this exercise __________ times a day. Cervical extension  Stand about 6 inches (15 cm) away from a wall, with your back facing the wall. Place a soft object, about 6-8 inches (15-20 cm) in diameter, between the back of your head and the wall. A soft object could be a small pillow, a ball, or a folded towel. Gently tilt your head back and press into the soft object. Keep your jaw and forehead relaxed. Hold for __________ seconds. Release the tension slowly. Relax your neck muscles completely before you repeat this exercise. Repeat __________ times. Complete this exercise __________ times a day. Posture and body mechanics Body mechanics refer to the movements and positions of your body while you do your daily activities. Posture is part of body mechanics. Good posture and healthy body mechanics can help to relieve stress in your body's tissues and joints. Good posture means that your spine is in its natural S-curve position (your spine is neutral), your shoulders are pulled back slightly, and your head is not tipped forward. The following are general guidelines for using improved posture and body mechanics in your everyday activities. Sitting  When sitting, keep your spine neutral and keep your feet flat on the floor. Use a footrest, if needed, and keep your thighs parallel to the floor. Avoid rounding  your shoulders. Avoid tilting your head forward. When working at a desk or a computer, keep your desk at a height where your hands are slightly lower than your elbows. Slide your chair under your desk so you are close enough to maintain good posture. When working at a computer, place your monitor at a height where you are looking straight ahead and you do not have to  tilt your head forward or downward to look at the screen. Standing  When standing, keep your spine neutral and keep your feet about hip-width apart. Keep a slight bend in your knees. Your ears, shoulders, and hips should line up. When you do a task in which you stand in one place for a long time, place one foot up on a stable object that is 2-4 inches (5-10 cm) high, such as a footstool. This helps keep your spine neutral. Resting When lying down and resting, avoid positions that are most painful for you. Try to support your neck in a neutral position. You can use a contour pillow or a small rolled-up towel. Your pillow should support your neck but not push on it. This information is not intended to replace advice given to you by your health care provider. Make sure you discuss any questions you have with your health care provider. Document Revised: 12/03/2022 Document Reviewed: 02/19/2022 Elsevier Patient Education  2024 [employer].

## 2025-06-11 ENCOUNTER — Encounter: Admitting: Internal Medicine

## 2025-06-18 ENCOUNTER — Ambulatory Visit: Admitting: Rheumatology
